# Patient Record
Sex: Female | Born: 1970
Health system: Southern US, Community
[De-identification: ages and names within clinical notes are randomized; demographics above are authoritative.]

## PROBLEM LIST (undated history)

## (undated) DIAGNOSIS — E162 Hypoglycemia, unspecified: Secondary | ICD-10-CM

## (undated) DIAGNOSIS — D1802 Hemangioma of intracranial structures: Secondary | ICD-10-CM

## (undated) HISTORY — PX: TUBAL LIGATION: SHX77

## (undated) HISTORY — DX: Hypoglycemia, unspecified: E16.2

---

## 2014-06-10 ENCOUNTER — Emergency Department
Admission: EM | Admit: 2014-06-10 | Discharge: 2014-06-10 | Disposition: A | Discharge status: Home / Self Care | Payer: BLUE CROSS/BLUE SHIELD | Source: Home / Self Care | Attending: Family Medicine | Admitting: Family Medicine

## 2014-06-10 ENCOUNTER — Encounter: Payer: Self-pay | Admitting: *Deleted

## 2014-06-10 ENCOUNTER — Emergency Department (INDEPENDENT_AMBULATORY_CARE_PROVIDER_SITE_OTHER): Payer: BLUE CROSS/BLUE SHIELD

## 2014-06-10 DIAGNOSIS — R51 Headache: Secondary | ICD-10-CM

## 2014-06-10 DIAGNOSIS — M899 Disorder of bone, unspecified: Secondary | ICD-10-CM | POA: Diagnosis not present

## 2014-06-10 DIAGNOSIS — G939 Disorder of brain, unspecified: Secondary | ICD-10-CM | POA: Diagnosis not present

## 2014-06-10 DIAGNOSIS — R519 Headache, unspecified: Secondary | ICD-10-CM

## 2014-06-10 MED ORDER — GADOBENATE DIMEGLUMINE 529 MG/ML IV SOLN
11.0000 mL | Freq: Once | INTRAVENOUS | Status: AC | PRN
  Administered 2014-06-10: 11 mL via INTRAVENOUS

## 2014-06-10 NOTE — Discharge Instructions (Signed)
May take Tylenol for pain as needed.

## 2014-06-10 NOTE — ED Provider Notes (Signed)
CSN: [id number]     Arrival date & time 06/10/14  1247 History   First MD Initiated Contact with Patient 06/10/14 1403     Chief Complaint  Patient presents with  . Headache      HPI Comments: Patient complains of a 2 week history of a dull left sided headache that was initially intermittent and now constant.  She notes that the headache is briefly worse when she rotates her head to the left.  She has tenderness over the left side of her head.  The headache awakens her at night, and she has had poor sleep for two months.  She denies other neurologic symptoms.  Motrin is not helpful. She reports that she had a sharp pain at the same location in her left head two months ago that resolved after about 6 days.  She feels well otherwise.  No fevers, chills, and sweats.  No changes in vision. She has a history of occasional migraine headaches that occur about twice per year, and are distinctly different from her present headache.  Patient is a 44 y.o. female presenting with headaches. The history is provided by the patient.  Headache Pain location:  L parietal and R temporal Quality:  Dull Radiates to:  Does not radiate Severity currently:  6/10 Onset quality:  Gradual Duration:  2 weeks Timing:  Constant Progression:  Worsening Chronicity:  Recurrent Similar to prior headaches: no   Context: not activity, not exposure to bright light, not coughing, not stress and not loud noise   Relieved by:  Nothing Worsened by:  Nothing Ineffective treatments:  NSAIDs Associated symptoms: fatigue   Associated symptoms: no abdominal pain, no blurred vision, no congestion, no dizziness, no ear pain, no eye pain, no facial pain, no fever, no focal weakness, no hearing loss, no loss of balance, no nausea, no near-syncope, no neck pain, no neck stiffness, no numbness, no paresthesias, no photophobia, no seizures, no sinus pressure, no swollen glands, no visual change and no weakness     History reviewed. No  pertinent past medical history. Past Surgical History  Procedure Laterality Date  . Cesarean section    . Tubal ligation Bilateral    Family History  Problem Relation Age of Onset  . Cancer Mother     Breast CA  . Diabetes Father    History  Substance Use Topics  . Smoking status: Never Smoker   . Smokeless tobacco: Never Used  . Alcohol Use: No   OB History    No data available     Review of Systems  Constitutional: Positive for fatigue. Negative for fever.  HENT: Negative for congestion, ear pain, hearing loss and sinus pressure.   Eyes: Negative for blurred vision, photophobia and pain.  Cardiovascular: Negative for near-syncope.  Gastrointestinal: Negative for nausea and abdominal pain.  Musculoskeletal: Negative for neck pain and neck stiffness.  Neurological: Positive for headaches. Negative for dizziness, focal weakness, seizures, weakness, numbness, paresthesias and loss of balance.  All other systems reviewed and are negative.   Allergies  Ciprofloxacin  Home Medications   Prior to Admission medications   Medication Sig Start Date End Date Taking? Authorizing Provider  Loratadine (CLARITIN PO) Take by mouth.   Yes Historical Provider, MD   BP 103/71 mmHg  Pulse 81  Temp(Src) 98.5 F (36.9 C) (Oral)  Resp 16  Ht 5' 4.5" (1.638 m)  Wt 124 lb (56.246 kg)  BMI 20.96 kg/m2  SpO2 99%  LMP 05/17/2014 Physical Exam  Constitutional: She is oriented to person, place, and time. She appears well-developed and well-nourished. No distress.  HENT:  Head: Atraumatic.    Right Ear: Tympanic membrane, external ear and ear canal normal.  Left Ear: Tympanic membrane, external ear and ear canal normal.  Nose: Nose normal.  Mouth/Throat: Oropharynx is clear and moist.  There is distinct tenderness over the left temporal/parietal area as noted on diagram.  No rash, swelling, or crepitance. No temporal artery tenderness  Eyes: Conjunctivae and EOM are normal. Pupils  are equal, round, and reactive to light.  Fundi benign.  No photophobia.  Neck: Normal range of motion. Neck supple. No thyromegaly present.  Cardiovascular: Normal rate, regular rhythm, normal heart sounds and intact distal pulses.   No murmur heard. Pulmonary/Chest: Breath sounds normal.  Abdominal: Bowel sounds are normal. There is no tenderness.  Musculoskeletal: She exhibits no edema.  Lymphadenopathy:    She has no cervical adenopathy.  Neurological: She is alert and oriented to person, place, and time. She has normal reflexes. She exhibits normal muscle tone. Coordination normal.  Cerebellar function is intact.  Skin: Skin is warm and dry. No rash noted.  Nursing note and vitals reviewed.   ED Course  Procedures  none  Imaging Review Mr Kizzie Fantasia Contrast  06/10/2014   CLINICAL DATA:  New onset of LEFT temporal and parietal headache. Constant for the past 10 days. Tender to the touch. Initial encounter.  EXAM: MRI HEAD WITHOUT AND WITH CONTRAST  TECHNIQUE: Multiplanar, multiecho pulse sequences of the brain and surrounding structures were obtained without and with intravenous contrast.  CONTRAST:  86mL MULTIHANCE GADOBENATE DIMEGLUMINE 529 MG/ML IV SOLN  COMPARISON:  None.  FINDINGS: Over the LEFT posterior frontal convexity, lying within the diploic space of the skull, is a subcentimeter T2 hyperintense diploic lesion, approximately 5 x 7 mm cross-section as seen on image 17 series 7.  The lesion does appear to display facilitated diffusion, but in the cortex immediately adjacent, there is asymmetric restricted diffusion confirmed on coronal imaging. Significance is uncertain. Focal area of mild ischemia not excluded. Infection appears unlikely.  The calvarial lesion is observed on T2 and FLAIR axial imaging, with a less well-defined area of both T2 and FLAIR hyperintensity as well as enhancement throughout the surrounding diploic space up to 1.5 cm in diameter. Postcontrast enhancement  is both focal and diffuse, again involving the small subcentimeter nidus as seen on postcontrast axial image 36 series 12, as well as more cloudlike in the diploic space extending around that structure as seen on coronal postcontrast image 10 series 13. I suspect this represents an intradiploic hemangioma. An intradiploic meningioma, epidermoid, or dermoid, are possibilities, but less favored. While a vascular metastasis cannot completely be excluded there is no history of such. The overlying scalp does not appear involved. The outer table of the skull is not clearly breached, but CT would be more definitive.  Within the brain itself, there is no visible acute hemorrhage, intra-axial mass lesion, hydrocephalus, or extra-axial fluid. Flow voids are maintained. No midline abnormality. Post infusion imaging demonstrates no abnormal enhancement of brain or meninges. Paranasal sinuses, mastoids, and orbits are unremarkable.  IMPRESSION: Abnormal enhancing lesion within the diploic space of the calvarium near the vertex posterior frontal region. Symptomatic calvarial hemangioma could have this appearance. Intradiploic epidermoid not excluded.  Tiny focus of diffusion restriction of the LEFT posterior frontal cortex immediately adjacent to this calvarial lesion of uncertain significance. See discussion above.   Electronically  Signed   By: Rolla Flatten M.D.   On: 06/10/2014 17:43     MDM   1. Left-sided headache   2. Skull lesion     May take Tylenol for pain as needed. Refer to neurosurgeon as soon as possible for further evaluation and treatment.   [name], MD 06/10/14 1910

## 2014-06-10 NOTE — ED Notes (Addendum)
Pt c/o HA on left side of head x 2 weeks, now unrelieved by IBF. 2 months ago she was feeling a sharp pain in the same location and later a shingles-like burning in the same area. She is otherwise asymptomatic denying visual disturbances, nausea, dizziness or gait changes. No H/O trauma.

## 2014-06-10 NOTE — ED Notes (Signed)
Patient returning @ 430 for MRI of head.

## 2014-06-11 ENCOUNTER — Telehealth: Payer: Self-pay | Admitting: Emergency Medicine

## 2014-06-11 NOTE — ED Notes (Signed)
Sent referral to [facility] for Dr Jovita Gamma to review, sent notes, MRI w & w/o contrast. They will call patient with appt.

## 2014-06-13 NOTE — ED Notes (Signed)
Apt with Dr. Sherwood Gambler scheduled for 06/25/14 @ 2pm. Pt scheduled and notified by [facility]. Rec'd fax notification .

## 2014-06-14 ENCOUNTER — Telehealth: Payer: Self-pay | Admitting: *Deleted

## 2014-06-28 ENCOUNTER — Encounter: Payer: Self-pay | Admitting: Family Medicine

## 2014-06-28 ENCOUNTER — Other Ambulatory Visit (HOSPITAL_COMMUNITY)
Admission: RE | Admit: 2014-06-28 | Discharge: 2014-06-28 | Disposition: A | Discharge status: Home / Self Care | Payer: BLUE CROSS/BLUE SHIELD | Source: Ambulatory Visit | Attending: Family Medicine | Admitting: Family Medicine

## 2014-06-28 ENCOUNTER — Ambulatory Visit (INDEPENDENT_AMBULATORY_CARE_PROVIDER_SITE_OTHER): Payer: BLUE CROSS/BLUE SHIELD | Admitting: Family Medicine

## 2014-06-28 VITALS — BP 111/67 | HR 82 | Ht 64.5 in | Wt 123.0 lb

## 2014-06-28 DIAGNOSIS — Z01419 Encounter for gynecological examination (general) (routine) without abnormal findings: Secondary | ICD-10-CM | POA: Insufficient documentation

## 2014-06-28 DIAGNOSIS — Z0000 Encounter for general adult medical examination without abnormal findings: Secondary | ICD-10-CM | POA: Diagnosis not present

## 2014-06-28 DIAGNOSIS — Z1151 Encounter for screening for human papillomavirus (HPV): Secondary | ICD-10-CM | POA: Insufficient documentation

## 2014-06-28 DIAGNOSIS — D1800 Hemangioma unspecified site: Secondary | ICD-10-CM

## 2014-06-28 DIAGNOSIS — B029 Zoster without complications: Secondary | ICD-10-CM | POA: Diagnosis not present

## 2014-06-28 DIAGNOSIS — Z1239 Encounter for other screening for malignant neoplasm of breast: Secondary | ICD-10-CM | POA: Diagnosis not present

## 2014-06-28 NOTE — Patient Instructions (Signed)
Dr. Janautica Netzley's General Advice Following Your Complete Physical Exam  The Benefits of Regular Exercise: Unless you suffer from an uncontrolled cardiovascular condition, studies strongly suggest that regular exercise and physical activity will add to both the quality and length of your life.  The World Health Organization recommends 150 minutes of moderate intensity aerobic activity every week.  This is best split over 3-4 days a week, and can be as simple as a brisk walk for just over 35 minutes "most days of the week".  This type of exercise has been shown to lower LDL-Cholesterol, lower average blood sugars, lower blood pressure, lower cardiovascular disease risk, improve memory, and increase one's overall sense of wellbeing.  The addition of anaerobic (or "strength training") exercises offers additional benefits including but not limited to increased metabolism, prevention of osteoporosis, and improved overall cholesterol levels.  How Can I Strive For A Low-Fat Diet?: Current guidelines recommend that 25-35 percent of your daily energy (food) intake should come from fats.  One might ask how can this be achieved without having to dissect each meal on a daily basis?  Switch to skim or 1% milk instead of whole milk.  Focus on lean meats such as ground turkey, fresh fish, baked chicken, and lean cuts of beef as your source of dietary protein.  Limit saturated fat consumption to less than 10% of your daily caloric intake.  Limit trans fatty acid consumption primarily by limiting synthetic trans fats such as partially hydrogenated oils (Ex: fried fast foods).  Substitute olive or vegetable oil for solid fats where possible.  Moderation of Salt Intake: Provided you don't carry a diagnosis of congestive heart failure nor renal failure, I recommend a daily allowance of no more than 2300 mg of salt (sodium).  Keeping under this daily goal is associated with a decreased risk of cardiovascular events, creeping  above it can lead to elevated blood pressures and increases your risk of cardiovascular events.  Milligrams (mg) of salt is listed on all nutrition labels, and your daily intake can add up faster than you think.  Most canned and frozen dinners can pack in over half your daily salt allowance in one meal.    Lifestyle Health Risks: Certain lifestyle choices carry specific health risks.  As you may already know, tobacco use has been associated with increasing one's risk of cardiovascular disease, pulmonary disease, numerous cancers, among many other issues.  What you may not know is that there are medications and nicotine replacement strategies that can more than double your chances of successfully quitting.  I would be thrilled to help manage your quitting strategy if you currently use tobacco products.  When it comes to alcohol use, I've yet to find an "ideal" daily allowance.  Provided an individual does not have a medical condition that is exacerbated by alcohol consumption, general guidelines determine "safe drinking" as no more than two standard drinks for a man or no more than one standard drink for a female per day.  However, much debate still exists on whether any amount of alcohol consumption is technically "safe".  My general advice, keep alcohol consumption to a minimum for general health promotion.  If you or others believe that alcohol, tobacco, or recreational drug use is interfering with your life, I would be happy to provide confidential counseling regarding treatment options.  General "Over The Counter" Nutrition Advice: Postmenopausal women should aim for a daily calcium intake of 1200 mg, however a significant portion of this might already be   provided by diets including milk, yogurt, cheese, and other dairy products.  Vitamin D has been shown to help preserve bone density, prevent fatigue, and has even been shown to help reduce falls in the elderly.  Ensuring a daily intake of 800 Units of  Vitamin D is a good place to start to enjoy the above benefits, we can easily check your Vitamin D level to see if you'd potentially benefit from supplementation beyond 800 Units a day.  Folic Acid intake should be of particular concern to women of childbearing age.  Daily consumption of 400-800 mcg of Folic Acid is recommended to minimize the chance of spinal cord defects in a fetus should pregnancy occur.    For many adults, accidents still remain one of the most common culprits when it comes to cause of death.  Some of the simplest but most effective preventitive habits you can adopt include regular seatbelt use, proper helmet use, securing firearms, and regularly testing your smoke and carbon monoxide detectors.  Nura Cahoon B. Skye Rodarte DO [facility] [address], [address] [place], [place] [address] Phone: [phone]  

## 2014-06-28 NOTE — Progress Notes (Signed)
CC: Barbara Nolan is a 44 y.o. female is here for Establish Care and CPE   Subjective: HPI:  Colonoscopy: No family history of colon cancer will begin screening at age 60 Papsmear: No history of abnormals, most recent Pap smear was in 2014, she would like to have it repeated today and she is aware that current guidelines only recommend this every 3 years. Mammogram: She gets these every 2 years and is overdue. Plan to continue this frequency until she turns 48, annually thereafter.  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 She has a history of shingles from 2011 and had a recent diagnosis of hemangioma involving the left skull which is being followed by neurosurgery and the only intervention is going to be a repeat MRI sometime later this year.  Review of Systems - General ROS: negative for - chills, fever, night sweats, weight gain or weight loss Ophthalmic ROS: negative for - decreased vision Psychological ROS: negative for - anxiety or depression ENT ROS: negative for - hearing change, nasal congestion, tinnitus or allergies Hematological and Lymphatic ROS: negative for - bleeding problems, bruising or swollen lymph nodes Breast ROS: negative Respiratory ROS: no cough, shortness of breath, or wheezing Cardiovascular ROS: no chest pain or dyspnea on exertion Gastrointestinal ROS: no abdominal pain, change in bowel habits, or black or bloody stools Genito-Urinary ROS: negative for - genital discharge, genital ulcers, incontinence or abnormal bleeding from genitals Musculoskeletal ROS: negative for - joint pain or muscle pain Neurological ROS: negative for - headaches or memory loss Dermatological ROS: negative for lumps, mole changes, rash and skin lesion changes   History reviewed. No pertinent past medical history.  Past Surgical History   Procedure Laterality Date  . Cesarean section    . Tubal ligation Bilateral    Family History  Problem Relation Age of Onset  . Cancer Mother     Breast CA  . Diabetes Father     History   Social History  . Marital Status: Married    Spouse Name: N/A  . Number of Children: N/A  . Years of Education: N/A   Occupational History  . Not on file.   Social History Main Topics  . Smoking status: Never Smoker   . Smokeless tobacco: Never Used  . Alcohol Use: No  . Drug Use: No  . Sexual Activity:    Partners: Male   Other Topics Concern  . Not on file   Social History Narrative     Objective: BP 111/67 mmHg  Pulse 82  Ht 5' 4.5" (1.638 m)  Wt 123 lb (55.792 kg)  BMI 20.79 kg/m2  LMP 05/17/2014 General: No Acute Distress HEENT: Atraumatic, normocephalic, conjunctivae normal without scleral icterus.  No nasal discharge, hearing grossly intact, TMs with good landmarks bilaterally with no middle ear abnormalities, posterior pharynx clear without oral lesions. Neck: Supple, trachea midline, no cervical nor supraclavicular adenopathy. Pulmonary: Clear to auscultation bilaterally without wheezing, rhonchi, nor rales. Cardiac: Regular rate and rhythm.  No murmurs, rubs, nor gallops. No peripheral edema.  2+ peripheral pulses bilaterally. Abdomen: Bowel sounds normal.  No masses.  Non-tender without rebound.  Negative Murphy's sign. GU: Labia majora & minora without lesions.  Distal urethra unremarkable.  Vaginal wall integrity preserved without mucosal lesions.  Cervix non-tender and without gross lesions nor discharge. MSK: Grossly intact, no signs of weakness.  Full strength throughout upper  and lower extremities.  Full ROM in upper and lower extremities.  No midline spinal tenderness. Neuro: Gait unremarkable, CN II-XII grossly intact.  C5-C6 Reflex 2/4 Bilaterally, L4 Reflex 2/4 Bilaterally.  Cerebellar function intact. Skin: No rashes. Psych: Alert and oriented to  person/place/time.  Thought process normal. No anxiety/depression.   Assessment & Plan: Blue was seen today for establish care and cpe.  Diagnoses and all orders for this visit:  Annual physical exam Orders: -     Lipid panel -     Comp Met (CMET) -     CBC -     Cytology - PAP  Screening breast examination Orders: -     MM DIGITAL SCREENING BILATERAL; Future  Hemangioma  Shingles  Healthy lifestyle interventions including but not limited to regular exercise, a healthy low fat diet, moderation of salt intake, the dangers of tobacco/alcohol/recreational drug use, nutrition supplementation, and accident avoidance were discussed with the patient and a handout was provided for future reference.  Return in about 1 year (around 06/28/2015) for Annual Visits.

## 2014-07-03 LAB — CYTOLOGY - PAP

## 2014-07-05 LAB — COMPREHENSIVE METABOLIC PANEL
AST: 14 U/L (ref 0–37)
Albumin: 3.9 g/dL (ref 3.5–5.2)
Alkaline Phosphatase: 67 U/L (ref 39–117)
BILIRUBIN TOTAL: 0.8 mg/dL (ref 0.2–1.2)
BUN: 6 mg/dL (ref 6–23)
CHLORIDE: 108 meq/L (ref 96–112)
CO2: 25 meq/L (ref 19–32)
Calcium: 9 mg/dL (ref 8.4–10.5)
Creat: 0.7 mg/dL (ref 0.50–1.10)
Glucose, Bld: 83 mg/dL (ref 70–99)
Potassium: 4.4 mEq/L (ref 3.5–5.3)
SODIUM: 137 meq/L (ref 135–145)
Total Protein: 6.3 g/dL (ref 6.0–8.3)

## 2014-07-05 LAB — CBC
HEMATOCRIT: 37.7 % (ref 36.0–46.0)
HEMOGLOBIN: 12.3 g/dL (ref 12.0–15.0)
MCH: 29.1 pg (ref 26.0–34.0)
MCHC: 32.6 g/dL (ref 30.0–36.0)
MCV: 89.3 fL (ref 78.0–100.0)
MPV: 10.1 fL (ref 8.6–12.4)
Platelets: 273 10*3/uL (ref 150–400)
RBC: 4.22 MIL/uL (ref 3.87–5.11)
RDW: 13.9 % (ref 11.5–15.5)
WBC: 5.2 10*3/uL (ref 4.0–10.5)

## 2014-07-05 LAB — LIPID PANEL
Cholesterol: 189 mg/dL (ref 0–200)
HDL: 55 mg/dL (ref >=46)
LDL Cholesterol: 116 mg/dL — ABNORMAL HIGH (ref 0–99)
Total CHOL/HDL Ratio: 3.4 Ratio
Triglycerides: 91 mg/dL (ref <150)
VLDL: 18 mg/dL (ref 0–40)

## 2014-07-10 ENCOUNTER — Ambulatory Visit (INDEPENDENT_AMBULATORY_CARE_PROVIDER_SITE_OTHER): Payer: BLUE CROSS/BLUE SHIELD

## 2014-07-10 DIAGNOSIS — Z1239 Encounter for other screening for malignant neoplasm of breast: Secondary | ICD-10-CM

## 2014-07-10 DIAGNOSIS — Z1231 Encounter for screening mammogram for malignant neoplasm of breast: Secondary | ICD-10-CM

## 2014-12-29 ENCOUNTER — Emergency Department
Admission: EM | Admit: 2014-12-29 | Discharge: 2014-12-29 | Disposition: A | Discharge status: Home / Self Care | Payer: BLUE CROSS/BLUE SHIELD | Source: Home / Self Care | Attending: Family Medicine | Admitting: Family Medicine

## 2014-12-29 ENCOUNTER — Encounter: Payer: Self-pay | Admitting: Emergency Medicine

## 2014-12-29 DIAGNOSIS — N3090 Cystitis, unspecified without hematuria: Secondary | ICD-10-CM | POA: Diagnosis not present

## 2014-12-29 LAB — POCT URINALYSIS DIP (MANUAL ENTRY)
Bilirubin, UA: NEGATIVE
GLUCOSE UA: NEGATIVE
Ketones, POC UA: NEGATIVE
NITRITE UA: NEGATIVE
PROTEIN UA: NEGATIVE
Spec Grav, UA: 1.015 (ref 1.005–1.03)
Urobilinogen, UA: 0.2 (ref 0–1)
pH, UA: 6 (ref 5–8)

## 2014-12-29 MED ORDER — NITROFURANTOIN MONOHYD MACRO 100 MG PO CAPS
100.0000 mg | ORAL_CAPSULE | Freq: Two times a day (BID) | ORAL | Status: DC
Start: 2014-12-29 — End: 2015-07-21

## 2014-12-29 NOTE — Discharge Instructions (Signed)
May use non-prescription AZO for about two days, if desired, to decrease urinary discomfort.  Increase fluid intake. If symptoms become significantly worse during the night or over the weekend, proceed to the local emergency room.  Followup with Family Doctor if not improved in one week.

## 2014-12-29 NOTE — ED Notes (Signed)
Reports onset of dysuria and frequency of urination over past 24 hours.

## 2014-12-29 NOTE — ED Provider Notes (Signed)
CSN: [id number]     Arrival date & time 12/29/14  1101 History   First MD Initiated Contact with Patient 12/29/14 1121     Chief Complaint  Patient presents with  . Dysuria  . Urinary Frequency      HPI Comments: Patient developed dysuria, frequency, and urgency within the last 24 hours.  Patient is a 44 y.o. female presenting with dysuria. The history is provided by the patient.  Dysuria Pain quality:  Burning Pain severity:  Mild Onset quality:  Sudden Duration:  1 day Timing:  Constant Progression:  Worsening Chronicity:  New Recent urinary tract infections: no   Relieved by:  None tried Worsened by:  Nothing tried Ineffective treatments:  None tried Urinary symptoms: frequent urination and hesitancy   Urinary symptoms: no discolored urine, no foul-smelling urine, no hematuria and no bladder incontinence   Associated symptoms: no abdominal pain, no fever, no flank pain, no nausea, no vaginal discharge and no vomiting   Risk factors: sexually active     History reviewed. No pertinent past medical history. Past Surgical History  Procedure Laterality Date  . Cesarean section    . Tubal ligation Bilateral    Family History  Problem Relation Age of Onset  . Cancer Mother     Breast CA  . Diabetes Father    Social History  Substance Use Topics  . Smoking status: Never Smoker   . Smokeless tobacco: Never Used  . Alcohol Use: No   OB History    No data available     Review of Systems  Constitutional: Negative for fever.  Gastrointestinal: Negative for nausea, vomiting and abdominal pain.  Genitourinary: Positive for dysuria. Negative for flank pain and vaginal discharge.  All other systems reviewed and are negative.   Allergies  Ciprofloxacin  Home Medications   Prior to Admission medications   Medication Sig Start Date End Date Taking? Authorizing Provider  Loratadine (CLARITIN PO) Take by mouth.    Historical Provider, MD  nitrofurantoin,  macrocrystal-monohydrate, (MACROBID) 100 MG capsule Take 1 capsule (100 mg total) by mouth 2 (two) times daily. Take with food. 12/29/14   [name], MD   Meds Ordered and Administered this Visit  Medications - No data to display  BP 103/69 mmHg  Pulse 90  Temp(Src) 98.2 F (36.8 C) (Oral)  Resp 16  Ht 5\' 4"  (1.626 m)  Wt 127 lb (57.607 kg)  BMI 21.79 kg/m2  SpO2 96% No data found.   Physical Exam Nursing notes and Vital Signs reviewed. Appearance:  Patient appears stated age, and in no acute distress Eyes:  Pupils are equal, round, and reactive to light and accomodation.  Extraocular movement is intact.  Conjunctivae are not inflamed  Nose:  Normal  Pharynx:  Normal; moist mucous membranes  Neck:  Supple.   No adenopathy Lungs:  Clear to auscultation.  Breath sounds are equal.  Moving air well. Heart:  Regular rate and rhythm without murmurs, rubs, or gallops.  Abdomen:  Nontender without masses or hepatosplenomegaly.  Bowel sounds are present.  No CVA or flank tenderness.  Extremities:  No edema.   Skin:  No rash present.   ED Course  Procedures  None    Labs Reviewed  URINE CULTURE  POCT URINALYSIS DIP (MANUAL ENTRY):  BLO small; LEU moderate; otherwise negative     MDM   1. Cystitis    Urine culture pending. Begin Macrobid 100mg  BID for one week. May use non-prescription AZO  for about two days, if desired, to decrease urinary discomfort.  Increase fluid intake. If symptoms become significantly worse during the night or over the weekend, proceed to the local emergency room.  Followup with Family Doctor if not improved in one week.     [name], MD 12/29/14 1136

## 2014-12-30 LAB — URINE CULTURE
COLONY COUNT: NO GROWTH
Organism ID, Bacteria: NO GROWTH

## 2014-12-31 ENCOUNTER — Telehealth: Payer: Self-pay | Admitting: Emergency Medicine

## 2015-07-21 ENCOUNTER — Ambulatory Visit (INDEPENDENT_AMBULATORY_CARE_PROVIDER_SITE_OTHER): Payer: BLUE CROSS/BLUE SHIELD | Admitting: Family Medicine

## 2015-07-21 ENCOUNTER — Encounter: Payer: Self-pay | Admitting: Family Medicine

## 2015-07-21 VITALS — BP 121/80 | HR 97 | Wt 127.0 lb

## 2015-07-21 DIAGNOSIS — Z1239 Encounter for other screening for malignant neoplasm of breast: Secondary | ICD-10-CM | POA: Diagnosis not present

## 2015-07-21 DIAGNOSIS — Z0000 Encounter for general adult medical examination without abnormal findings: Secondary | ICD-10-CM

## 2015-07-21 NOTE — Patient Instructions (Signed)
Dr. Poonam Woehrle's General Advice Following Your Complete Physical Exam  The Benefits of Regular Exercise: Unless you suffer from an uncontrolled cardiovascular condition, studies strongly suggest that regular exercise and physical activity will add to both the quality and length of your life.  The World Health Organization recommends 150 minutes of moderate intensity aerobic activity every week.  This is best split over 3-4 days a week, and can be as simple as a brisk walk for just over 35 minutes "most days of the week".  This type of exercise has been shown to lower LDL-Cholesterol, lower average blood sugars, lower blood pressure, lower cardiovascular disease risk, improve memory, and increase one's overall sense of wellbeing.  The addition of anaerobic (or "strength training") exercises offers additional benefits including but not limited to increased metabolism, prevention of osteoporosis, and improved overall cholesterol levels.  How Can I Strive For A Low-Fat Diet?: Current guidelines recommend that 25-35 percent of your daily energy (food) intake should come from fats.  One might ask how can this be achieved without having to dissect each meal on a daily basis?  Switch to skim or 1% milk instead of whole milk.  Focus on lean meats such as ground turkey, fresh fish, baked chicken, and lean cuts of beef as your source of dietary protein.  Limit saturated fat consumption to less than 10% of your daily caloric intake.  Limit trans fatty acid consumption primarily by limiting synthetic trans fats such as partially hydrogenated oils (Ex: fried fast foods).  Substitute olive or vegetable oil for solid fats where possible.  Moderation of Salt Intake: Provided you don't carry a diagnosis of congestive heart failure nor renal failure, I recommend a daily allowance of no more than 2300 mg of salt (sodium).  Keeping under this daily goal is associated with a decreased risk of cardiovascular events, creeping  above it can lead to elevated blood pressures and increases your risk of cardiovascular events.  Milligrams (mg) of salt is listed on all nutrition labels, and your daily intake can add up faster than you think.  Most canned and frozen dinners can pack in over half your daily salt allowance in one meal.    Lifestyle Health Risks: Certain lifestyle choices carry specific health risks.  As you may already know, tobacco use has been associated with increasing one's risk of cardiovascular disease, pulmonary disease, numerous cancers, among many other issues.  What you may not know is that there are medications and nicotine replacement strategies that can more than double your chances of successfully quitting.  I would be thrilled to help manage your quitting strategy if you currently use tobacco products.  When it comes to alcohol use, I've yet to find an "ideal" daily allowance.  Provided an individual does not have a medical condition that is exacerbated by alcohol consumption, general guidelines determine "safe drinking" as no more than two standard drinks for a man or no more than one standard drink for a female per day.  However, much debate still exists on whether any amount of alcohol consumption is technically "safe".  My general advice, keep alcohol consumption to a minimum for general health promotion.  If you or others believe that alcohol, tobacco, or recreational drug use is interfering with your life, I would be happy to provide confidential counseling regarding treatment options.  General "Over The Counter" Nutrition Advice: Postmenopausal women should aim for a daily calcium intake of 1200 mg, however a significant portion of this might already be   provided by diets including milk, yogurt, cheese, and other dairy products.  Vitamin D has been shown to help preserve bone density, prevent fatigue, and has even been shown to help reduce falls in the elderly.  Ensuring a daily intake of 800 Units of  Vitamin D is a good place to start to enjoy the above benefits, we can easily check your Vitamin D level to see if you'd potentially benefit from supplementation beyond 800 Units a day.  Folic Acid intake should be of particular concern to women of childbearing age.  Daily consumption of 400-800 mcg of Folic Acid is recommended to minimize the chance of spinal cord defects in a fetus should pregnancy occur.    For many adults, accidents still remain one of the most common culprits when it comes to cause of death.  Some of the simplest but most effective preventitive habits you can adopt include regular seatbelt use, proper helmet use, securing firearms, and regularly testing your smoke and carbon monoxide detectors.  Vaun Hyndman B. Zeferino Mounts DO [facility] [address], [address] [place], [place] [address] Phone: [phone]  

## 2015-07-21 NOTE — Progress Notes (Signed)
CC: Barbara Nolan is a 45 y.o. female is here for Annual Exam   Subjective: HPI:  Colonoscopy: No current indication Papsmear:Normal last year repeat 06/2017 Mammogram: Due for repeat, order placed   Influenza Vaccine: out of season Pneumovax: no indication Td/Tdap: 2007, she is agreeable to a booster but asks not to get it today and to postpone it for a few days. Zoster: (Start 45 yo)   Requesting physical exam with no complaints  Review of Systems - General ROS: negative for - chills, fever, night sweats, weight gain or weight loss Ophthalmic ROS: negative for - decreased vision Psychological ROS: negative for - anxiety or depression ENT ROS: negative for - hearing change, nasal congestion, tinnitus or allergies Hematological and Lymphatic ROS: negative for - bleeding problems, bruising or swollen lymph nodes Breast ROS: negative Respiratory ROS: no cough, shortness of breath, or wheezing Cardiovascular ROS: no chest pain or dyspnea on exertion Gastrointestinal ROS: no abdominal pain, change in bowel habits, or black or bloody stools Genito-Urinary ROS: negative for - genital discharge, genital ulcers, incontinence or abnormal bleeding from genitals Musculoskeletal ROS: negative for - joint pain or muscle pain Neurological ROS: negative for - headaches or memory loss Dermatological ROS: negative for lumps, mole changes, rash and skin lesion changes  No past medical history on file.  Past Surgical History  Procedure Laterality Date  . Cesarean section    . Tubal ligation Bilateral    Family History  Problem Relation Age of Onset  . Cancer Mother     Breast CA  . Diabetes Father     Social History   Social History  . Marital Status: Married    Spouse Name: N/A  . Number of Children: N/A  . Years of Education: N/A   Occupational History  . Not on file.   Social History Main Topics  . Smoking status: Never Smoker   . Smokeless tobacco: Never Used  . Alcohol Use:  No  . Drug Use: No  . Sexual Activity:    Partners: Male   Other Topics Concern  . Not on file   Social History Narrative     Objective: BP 121/80 mmHg  Pulse 97  Wt 127 lb (57.607 kg)  General: No Acute Distress HEENT: Atraumatic, normocephalic, conjunctivae normal without scleral icterus.  No nasal discharge, hearing grossly intact, TMs with good landmarks bilaterally with no middle ear abnormalities, posterior pharynx clear without oral lesions. Neck: Supple, trachea midline, no cervical nor supraclavicular adenopathy. Pulmonary: Clear to auscultation bilaterally without wheezing, rhonchi, nor rales. Cardiac: Regular rate and rhythm.  No murmurs, rubs, nor gallops. No peripheral edema.  2+ peripheral pulses bilaterally. Abdomen: Bowel sounds normal.  No masses.  Non-tender without rebound.  Negative Murphy's sign. MSK: Grossly intact, no signs of weakness.  Full strength throughout upper and lower extremities.  Full ROM in upper and lower extremities.  No midline spinal tenderness. Neuro: Gait unremarkable, CN II-XII grossly intact.  C5-C6 Reflex 2/4 Bilaterally, L4 Reflex 2/4 Bilaterally.  Cerebellar function intact. Skin: No rashes. Psych: Alert and oriented to person/place/time.  Thought process normal. No anxiety/depression.  Assessment & Plan: Barbara Nolan was seen today for annual exam.  Diagnoses and all orders for this visit:  Annual physical exam -     Lipid panel -     CBC -     COMPLETE METABOLIC PANEL WITH GFR  Screening for breast cancer -     MM DIGITAL SCREENING BILATERAL; Future   Healthy lifestyle  interventions including but not limited to regular exercise, a healthy low fat diet, moderation of salt intake, the dangers of tobacco/alcohol/recreational drug use, nutrition supplementation, and accident avoidance were discussed with the patient and a handout was provided for future reference.  Return for CPE.

## 2015-07-30 LAB — CBC
HCT: 36.4 % (ref 35.0–45.0)
Hemoglobin: 11.7 g/dL (ref 11.7–15.5)
MCH: 28 pg (ref 27.0–33.0)
MCHC: 32.1 g/dL (ref 32.0–36.0)
MCV: 87.1 fL (ref 80.0–100.0)
MPV: 9.9 fL (ref 7.5–12.5)
PLATELETS: 334 10*3/uL (ref 140–400)
RBC: 4.18 MIL/uL (ref 3.80–5.10)
RDW: 14.1 % (ref 11.0–15.0)
WBC: 4.7 10*3/uL (ref 3.8–10.8)

## 2015-07-31 ENCOUNTER — Ambulatory Visit (INDEPENDENT_AMBULATORY_CARE_PROVIDER_SITE_OTHER): Payer: BLUE CROSS/BLUE SHIELD | Admitting: Sports Medicine

## 2015-07-31 DIAGNOSIS — Z23 Encounter for immunization: Secondary | ICD-10-CM

## 2015-07-31 LAB — COMPLETE METABOLIC PANEL WITH GFR
ALT: 6 U/L (ref 6–29)
AST: 15 U/L (ref 10–35)
Albumin: 4.1 g/dL (ref 3.6–5.1)
Alkaline Phosphatase: 69 U/L (ref 33–115)
BILIRUBIN TOTAL: 0.8 mg/dL (ref 0.2–1.2)
BUN: 9 mg/dL (ref 7–25)
CO2: 22 mmol/L (ref 20–31)
Calcium: 8.7 mg/dL (ref 8.6–10.2)
Chloride: 105 mmol/L (ref 98–110)
Creat: 0.81 mg/dL (ref 0.50–1.10)
GFR, EST NON AFRICAN AMERICAN: 87 mL/min (ref >=60)
GLUCOSE: 87 mg/dL (ref 65–99)
POTASSIUM: 3.8 mmol/L (ref 3.5–5.3)
Sodium: 138 mmol/L (ref 135–146)
Total Protein: 6.7 g/dL (ref 6.1–8.1)

## 2015-07-31 LAB — LIPID PANEL
CHOL/HDL RATIO: 3.4 ratio (ref <=5.0)
Cholesterol: 196 mg/dL (ref 125–200)
HDL: 58 mg/dL (ref >=46)
LDL CALC: 114 mg/dL (ref <130)
Triglycerides: 119 mg/dL (ref <150)
VLDL: 24 mg/dL (ref <30)

## 2015-07-31 NOTE — Progress Notes (Signed)
Pt presents to clinic for tdap injection. Inject was giving in the right deltoid. Pt tolerated injection well without any complications. -[facility]

## 2015-07-31 NOTE — Patient Instructions (Signed)
Pt advise to f/u prn.

## 2015-08-08 ENCOUNTER — Ambulatory Visit (INDEPENDENT_AMBULATORY_CARE_PROVIDER_SITE_OTHER): Payer: BLUE CROSS/BLUE SHIELD

## 2015-08-08 DIAGNOSIS — Z1231 Encounter for screening mammogram for malignant neoplasm of breast: Secondary | ICD-10-CM

## 2015-08-08 DIAGNOSIS — Z1239 Encounter for other screening for malignant neoplasm of breast: Secondary | ICD-10-CM

## 2015-12-09 ENCOUNTER — Ambulatory Visit (INDEPENDENT_AMBULATORY_CARE_PROVIDER_SITE_OTHER): Payer: BLUE CROSS/BLUE SHIELD | Admitting: Physician Assistant

## 2015-12-09 ENCOUNTER — Encounter: Payer: Self-pay | Admitting: Physician Assistant

## 2015-12-09 VITALS — BP 132/69 | HR 95 | Ht 64.0 in | Wt 127.0 lb

## 2015-12-09 DIAGNOSIS — L74512 Primary focal hyperhidrosis, palms: Secondary | ICD-10-CM | POA: Diagnosis not present

## 2015-12-09 DIAGNOSIS — R5383 Other fatigue: Secondary | ICD-10-CM

## 2015-12-09 DIAGNOSIS — E162 Hypoglycemia, unspecified: Secondary | ICD-10-CM | POA: Diagnosis not present

## 2015-12-09 DIAGNOSIS — R42 Dizziness and giddiness: Secondary | ICD-10-CM

## 2015-12-09 DIAGNOSIS — R251 Tremor, unspecified: Secondary | ICD-10-CM | POA: Diagnosis not present

## 2015-12-09 LAB — POCT URINALYSIS DIPSTICK
Bilirubin, UA: NEGATIVE
Glucose, UA: NEGATIVE
KETONES UA: NEGATIVE
Leukocytes, UA: NEGATIVE
NITRITE UA: NEGATIVE
Protein, UA: NEGATIVE
Spec Grav, UA: 1.015
Urobilinogen, UA: 0.2
pH, UA: 7.5

## 2015-12-09 LAB — GLUCOSE, POCT (MANUAL RESULT ENTRY): POC Glucose: 106 mg/dl — AB (ref 70–99)

## 2015-12-09 MED ORDER — AMBULATORY NON FORMULARY MEDICATION: 0 refills | Status: AC

## 2015-12-09 NOTE — Progress Notes (Signed)
   Subjective:    Patient ID: Barbara Nolan, female    DOB: [birth year], 45 y.o.   MRN: [id number]  HPI  Pt is a 45 yo female who presents to the clinic with what she feels like might be low blood sugar symptoms. She is an Therapist, sports. Her father and grandfather both have diabetes. She doesn't eat breakfast a lot. She has episodes where she is shaky, dizzy, sweaty, and feels like in a brain fog once she eats she feels better. Thursday felt symptomatic and ate 2 peanut butter cookes and felt much better. No increased thirst or urination.     Review of Systems  All other systems reviewed and are negative.      Objective:   Physical Exam  Constitutional: She is oriented to person, place, and time. She appears well-developed and well-nourished.  HENT:  Head: Normocephalic and atraumatic.  Neck: Normal range of motion. Neck supple. No thyromegaly present.  Cardiovascular: Normal rate, regular rhythm and normal heart sounds.   Pulmonary/Chest: Effort normal and breath sounds normal.  Lymphadenopathy:    She has no cervical adenopathy.  Neurological: She is alert and oriented to person, place, and time.  Psychiatric: She has a normal mood and affect. Her behavior is normal.          Assessment & Plan:  Marland KitchenMarland KitchenAngalina was seen today for hypoglycemia.  Diagnoses and all orders for this visit:  Hypoglycemia -     Hemoglobin A1c -     TSH -     Lipase -     C-peptide -     Basic metabolic panel -     POCT urinalysis dipstick  Dizziness -     POCT Glucose (CBG) -     Hemoglobin A1c -     TSH -     Lipase -     C-peptide -     Basic metabolic panel  Shaky  Sweaty palms  No energy  Other orders -     AMBULATORY NON FORMULARY MEDICATION; Blood glucometer, lancets and strips to test up to twice a day for hypoglycemic events.      .. Results for orders placed or performed in visit on 12/09/15  POCT Glucose (CBG)  Result Value Ref Range   POC Glucose 106 (A) 70 - 99 mg/dl  POCT  urinalysis dipstick  Result Value Ref Range   Color, UA yellow    Clarity, UA clear    Glucose, UA neg    Bilirubin, UA neg    Ketones, UA neg    Spec Grav, UA 1.015    Blood, UA trace-intact    pH, UA 7.5    Protein, UA neg    Urobilinogen, UA 0.2    Nitrite, UA neg    Leukocytes, UA Negative Negative   Reassured patient glucose today looked good. Urine good.   Likely sugar is dropping some. Given glucometer to monitor.  Discussed how to control low sugars and how can be a pre-cursor to diabetes. Will continue to monitor.

## 2015-12-09 NOTE — Patient Instructions (Signed)

## 2015-12-10 DIAGNOSIS — R42 Dizziness and giddiness: Secondary | ICD-10-CM | POA: Insufficient documentation

## 2015-12-10 DIAGNOSIS — R251 Tremor, unspecified: Secondary | ICD-10-CM | POA: Insufficient documentation

## 2015-12-10 DIAGNOSIS — L74512 Primary focal hyperhidrosis, palms: Secondary | ICD-10-CM | POA: Insufficient documentation

## 2015-12-10 DIAGNOSIS — R5383 Other fatigue: Secondary | ICD-10-CM | POA: Insufficient documentation

## 2015-12-10 LAB — BASIC METABOLIC PANEL
BUN: 12 mg/dL (ref 7–25)
CALCIUM: 9.1 mg/dL (ref 8.6–10.2)
CO2: 25 mmol/L (ref 20–31)
Chloride: 104 mmol/L (ref 98–110)
Creat: 0.92 mg/dL (ref 0.50–1.10)
GLUCOSE: 92 mg/dL (ref 65–99)
Potassium: 4.2 mmol/L (ref 3.5–5.3)
SODIUM: 138 mmol/L (ref 135–146)

## 2015-12-10 LAB — C-PEPTIDE: C-Peptide: 1.84 ng/mL (ref 0.80–3.85)

## 2015-12-10 LAB — TSH: TSH: 1.19 mIU/L

## 2015-12-10 LAB — HEMOGLOBIN A1C
Hgb A1c MFr Bld: 5.4 % (ref <5.7)
MEAN PLASMA GLUCOSE: 108 mg/dL

## 2015-12-10 LAB — LIPASE: Lipase: 28 U/L (ref 7–60)

## 2015-12-10 NOTE — Progress Notes (Signed)
Call pt:  Labs are very reassuring. C-peptide normal range.  Thyroid looks great.  Lipase normal.  A!C good at 5.4. (5.7 is the start of pre-diabetic range)  Let me know what your sugars are when you have symptoms of hypoglycemia.

## 2016-04-14 DIAGNOSIS — M899 Disorder of bone, unspecified: Secondary | ICD-10-CM | POA: Diagnosis not present

## 2016-04-14 DIAGNOSIS — G9389 Other specified disorders of brain: Secondary | ICD-10-CM | POA: Diagnosis not present

## 2016-04-23 DIAGNOSIS — M899 Disorder of bone, unspecified: Secondary | ICD-10-CM | POA: Diagnosis not present

## 2016-05-06 ENCOUNTER — Encounter: Payer: Self-pay | Admitting: *Deleted

## 2016-05-06 ENCOUNTER — Emergency Department
Admission: EM | Admit: 2016-05-06 | Discharge: 2016-05-06 | Disposition: A | Discharge status: Home / Self Care | Payer: BLUE CROSS/BLUE SHIELD | Source: Home / Self Care | Attending: Family Medicine | Admitting: Family Medicine

## 2016-05-06 DIAGNOSIS — B9789 Other viral agents as the cause of diseases classified elsewhere: Secondary | ICD-10-CM | POA: Diagnosis not present

## 2016-05-06 DIAGNOSIS — J069 Acute upper respiratory infection, unspecified: Secondary | ICD-10-CM

## 2016-05-06 LAB — POCT RAPID STREP A (OFFICE): Rapid Strep A Screen: NEGATIVE

## 2016-05-06 MED ORDER — PREDNISONE 20 MG PO TABS: ORAL_TABLET | ORAL | 0 refills | Status: DC

## 2016-05-06 MED ORDER — AMOXICILLIN 875 MG PO TABS
875.0000 mg | ORAL_TABLET | Freq: Two times a day (BID) | ORAL | 0 refills | Status: DC
Start: 2016-05-06 — End: 2016-08-16

## 2016-05-06 NOTE — Discharge Instructions (Signed)
Take plain guaifenesin (1200mg extended release tabs such as Mucinex) twice daily, with plenty of water, for cough and congestion.  May add Pseudoephedrine (30mg, one or two every 4 to 6 hours) for sinus congestion.  Get adequate rest.   °May use Afrin nasal spray (or generic oxymetazoline) each morning for about 5 days and then discontinue.  Also recommend using saline nasal spray several times daily and saline nasal irrigation (AYR is a common brand).  Use Flonase nasal spray each morning after using Afrin nasal spray and saline nasal irrigation. °Try warm salt water gargles for sore throat.  °Stop all antihistamines for now, and other non-prescription cough/cold preparations. °May take Delsym Cough Suppressant at bedtime for nighttime cough.  °Begin Amoxicillin if not improving about one week or if persistent fever develops   °Follow-up with family doctor if not improving about10 days.  °

## 2016-05-06 NOTE — ED Provider Notes (Signed)
[name]    CSN: [id number] Arrival date & time: 05/06/16  0802     History   Chief Complaint Chief Complaint  Patient presents with  . Sore Throat  . Otalgia    HPI Barbara Nolan is a 46 y.o. female.   Patient developed a mild sore throat 4 days ago that has now become worse.  This morning she developed left earache.  She developed mild cough yesterday, and intermittent minimal sinus congestion.  She has been fatigued, but no fevers, chills, and sweats.  She has a history of seasonal rhinitis, and past history of otitis media.   The history is provided by the patient.    History reviewed. No pertinent past medical history.  Patient Active Problem List   Diagnosis Date Noted  . No energy 12/10/2015  . Sweaty palms 12/10/2015  . Shaky 12/10/2015  . Dizziness 12/10/2015  . Hemangioma 06/28/2014  . Shingles 06/28/2014    Past Surgical History:  Procedure Laterality Date  . CESAREAN SECTION    . TUBAL LIGATION Bilateral     OB History    No data available       Home Medications    Prior to Admission medications   Medication Sig Start Date End Date Taking? Authorizing Provider  AMBULATORY NON FORMULARY MEDICATION Blood glucometer, lancets and strips to test up to twice a day for hypoglycemic events. 12/09/15   Jade L Breeback, PA-C  amoxicillin (AMOXIL) 875 MG tablet Take 1 tablet (875 mg total) by mouth 2 (two) times daily. 05/06/16   [name], MD  Loratadine (CLARITIN PO) Take by mouth as needed.     Historical Provider, MD  predniSONE (DELTASONE) 20 MG tablet Take one tab by mouth twice daily for 5 days, then one daily for 3 days. Take with food. 05/06/16   [name], MD    Family History Family History  Problem Relation Age of Onset  . Cancer Mother     Breast CA  . Diabetes Father     Social History Social History  Substance Use Topics  . Smoking status: Never Smoker  . Smokeless tobacco: Never Used  . Alcohol use No      Allergies   Ciprofloxacin   Review of Systems Review of Systems + sore throat + cough No pleuritic pain No wheezing + mild nasal congestion ? post-nasal drainage No sinus pain/pressure No itchy/red eyes + left earache No hemoptysis No SOB No fever/chills No nausea No vomiting No abdominal pain No diarrhea No urinary symptoms No skin rash + fatigue No myalgias No headache Used OTC meds without relief   Physical Exam Triage Vital Signs ED Triage Vitals [05/06/16 0815]  Enc Vitals Group     BP 124/78     Pulse Rate 93     Resp      Temp 98.1 F (36.7 C)     Temp Source Oral     SpO2 98 %     Weight 128 lb (58.1 kg)     Height      Head Circumference      Peak Flow      Pain Score 5     Pain Loc      Pain Edu?      Excl. in [place]?    No data found.   Updated Vital Signs BP 124/78 (BP Location: Left Arm)   Pulse 93   Temp 98.1 F (36.7 C) (Oral)   Wt 128  lb (58.1 kg)   LMP 04/15/2016   SpO2 98%   BMI 21.97 kg/m   Visual Acuity Right Eye Distance:   Left Eye Distance:   Bilateral Distance:    Right Eye Near:   Left Eye Near:    Bilateral Near:     Physical Exam Nursing notes and Vital Signs reviewed. Appearance:  Patient appears stated age, and in no acute distress Eyes:  Pupils are equal, round, and reactive to light and accomodation.  Extraocular movement is intact.  Conjunctivae are not inflamed  Ears:  Left canal and tympanic membrane normal.  Right canal partly occluded with cerumen, and portion of tympanic membrane visualized appears normal. Nose:  Mildly congested turbinates.  No sinus tenderness.  Pharynx:  Normal Neck:  Supple.  Tender enlarged posterior/lateral nodes are palpated bilaterally  Lungs:  Clear to auscultation.  Breath sounds are equal.  Moving air well. Heart:  Regular rate and rhythm without murmurs, rubs, or gallops.  Abdomen:  Nontender without masses or hepatosplenomegaly.  Bowel sounds are present.  No CVA or  flank tenderness.  Extremities:  No edema.  Skin:  No rash present.    UC Treatments / Results  Labs (all labs ordered are listed, but only abnormal results are displayed)   Labs Reviewed  POCT RAPID STREP A (OFFICE) negative  Tympanometry:  Right ear tympanogram normal; Left ear tympanogram normal  EKG  EKG Interpretation None       Radiology No results found.  Procedures Procedures (including critical care time)  Medications Ordered in UC Medications - No data to display   Initial Impression / Assessment and Plan / UC Course  I have reviewed the triage vital signs and the nursing notes.  Pertinent labs & imaging results that were available during my care of the patient were reviewed by me and considered in my medical decision making (see chart for details).    There is no evidence of bacterial infection today.   Begin prednisone burst/taper. Take plain guaifenesin (1200mg  extended release tabs such as Mucinex) twice daily, with plenty of water, for cough and congestion.  May add Pseudoephedrine (30mg , one or two every 4 to 6 hours) for sinus congestion.  Get adequate rest.   May use Afrin nasal spray (or generic oxymetazoline) each morning for about 5 days and then discontinue.  Also recommend using saline nasal spray several times daily and saline nasal irrigation (AYR is a common brand).  Use Flonase nasal spray each morning after using Afrin nasal spray and saline nasal irrigation. Try warm salt water gargles for sore throat.  Stop all antihistamines for now, and other non-prescription cough/cold preparations. May take Delsym Cough Suppressant at bedtime for nighttime cough.  Begin Amoxicillin if not improving about one week or if persistent fever develops (Given a prescription to hold, with an expiration date)  Follow-up with family doctor if not improving about10 days.     Final Clinical Impressions(s) / UC Diagnoses   Final diagnoses:  Viral URI with cough     New Prescriptions New Prescriptions   AMOXICILLIN (AMOXIL) 875 MG TABLET    Take 1 tablet (875 mg total) by mouth 2 (two) times daily.   PREDNISONE (DELTASONE) 20 MG TABLET    Take one tab by mouth twice daily for 5 days, then one daily for 3 days. Take with food.     [name], MD 05/06/16 [phone]

## 2016-05-06 NOTE — ED Triage Notes (Signed)
Patient c/o 3 days of scratchy throat, developed pain last night and left ear pain this am. Afebrile, minimally congested. Taken IBF 400mg  @6am 

## 2016-08-16 ENCOUNTER — Encounter: Payer: Self-pay | Admitting: *Deleted

## 2016-08-16 ENCOUNTER — Emergency Department (INDEPENDENT_AMBULATORY_CARE_PROVIDER_SITE_OTHER)
Admission: EM | Admit: 2016-08-16 | Discharge: 2016-08-16 | Disposition: A | Discharge status: Home / Self Care | Payer: BLUE CROSS/BLUE SHIELD | Source: Home / Self Care | Attending: Family Medicine | Admitting: Family Medicine

## 2016-08-16 DIAGNOSIS — N3000 Acute cystitis without hematuria: Secondary | ICD-10-CM

## 2016-08-16 HISTORY — DX: Hemangioma of intracranial structures: D18.02

## 2016-08-16 MED ORDER — NITROFURANTOIN MONOHYD MACRO 100 MG PO CAPS: 100.0000 mg | ORAL_CAPSULE | Freq: Two times a day (BID) | ORAL | 0 refills | Status: DC

## 2016-08-16 NOTE — ED Triage Notes (Signed)
Patient c/o dysuria, frequency, flank pain and lower abdominal pain/burning x last night. Took AZO @ 9pm last night. Afebrile.

## 2016-08-16 NOTE — ED Provider Notes (Signed)
[name]    CSN: [id number] Arrival date & time: 08/16/16  0845     History   Chief Complaint Chief Complaint  Patient presents with  . Dysuria  . Abdominal Pain  . Flank Pain    HPI Barbara Nolan is a 46 y.o. female.   Patient yesterday suddenly developed dysuria, frequency, and lower abdominal pressure.   The history is provided by the patient.  Dysuria  Pain quality:  Burning Pain severity:  Moderate Onset quality:  Sudden Duration:  1 day Timing:  Constant Progression:  Worsening Chronicity:  New Recent urinary tract infections: no   Relieved by:  Phenazopyridine Worsened by:  Nothing Urinary symptoms: frequent urination and hesitancy   Urinary symptoms: no discolored urine, no foul-smelling urine, no hematuria and no bladder incontinence   Associated symptoms: abdominal pain and flank pain   Associated symptoms: no nausea and no vomiting   Abdominal Pain  Associated symptoms: dysuria   Associated symptoms: no nausea and no vomiting   Flank Pain  Associated symptoms include abdominal pain.    Past Medical History:  Diagnosis Date  . Hemangioma of intracranial structure [facility])     Patient Active Problem List   Diagnosis Date Noted  . No energy 12/10/2015  . Sweaty palms 12/10/2015  . Shaky 12/10/2015  . Dizziness 12/10/2015  . Hemangioma 06/28/2014  . Shingles 06/28/2014    Past Surgical History:  Procedure Laterality Date  . CESAREAN SECTION    . TUBAL LIGATION Bilateral     OB History    No data available       Home Medications    Prior to Admission medications   Medication Sig Start Date End Date Taking? Authorizing Provider  AMBULATORY NON FORMULARY MEDICATION Blood glucometer, lancets and strips to test up to twice a day for hypoglycemic events. 12/09/15   Breeback, Jade L, PA-C  Loratadine (CLARITIN PO) Take by mouth as needed.     [provider]  nitrofurantoin, macrocrystal-monohydrate, (MACROBID) 100 MG  capsule Take 1 capsule (100 mg total) by mouth 2 (two) times daily. Take with food. 08/16/16   [name], MD    Family History Family History  Problem Relation Age of Onset  . Cancer Mother        Breast CA  . Diabetes Father     Social History Social History  Substance Use Topics  . Smoking status: Never Smoker  . Smokeless tobacco: Never Used  . Alcohol use No     Allergies   Ciprofloxacin   Review of Systems Review of Systems  Gastrointestinal: Positive for abdominal pain. Negative for nausea and vomiting.  Genitourinary: Positive for dysuria and flank pain.  All other systems reviewed and are negative.    Physical Exam Triage Vital Signs ED Triage Vitals  Enc Vitals Group     BP 08/16/16 0854 112/68     Pulse Rate 08/16/16 0854 84     Resp --      Temp 08/16/16 0854 97.7 F (36.5 C)     Temp Source 08/16/16 0854 Oral     SpO2 08/16/16 0854 98 %     Weight 08/16/16 0857 130 lb (59 kg)     Height 08/16/16 0857 5\' 5"  (1.651 m)     Head Circumference --      Peak Flow --      Pain Score 08/16/16 0857 0     Pain Loc --      Pain  Edu? --      Excl. in [place]? --    No data found.   Updated Vital Signs BP 112/68 (BP Location: Left Arm)   Pulse 84   Temp 97.7 F (36.5 C) (Oral)   Ht 5\' 5"  (1.651 m)   Wt 130 lb (59 kg)   LMP 07/12/2016   SpO2 98%   BMI 21.63 kg/m   Visual Acuity Right Eye Distance:   Left Eye Distance:   Bilateral Distance:    Right Eye Near:   Left Eye Near:    Bilateral Near:     Physical Exam Nursing notes and Vital Signs reviewed. Appearance:  Patient appears stated age, and in no acute distress.    Eyes:  Pupils are equal, round, and reactive to light and accomodation.  Extraocular movement is intact.  Conjunctivae are not inflamed   Lungs: Normal respiratory rate.  Moving air well. Heart:   Normal rate. Abdomen:  Nontender without masses or hepatosplenomegaly.  Bowel sounds are present.  No CVA or flank tenderness.        UC Treatments / Results  Labs (all labs ordered are listed, but only abnormal results are displayed) Labs Reviewed - No data to display  EKG  EKG Interpretation None       Radiology No results found.  Procedures Procedures (including critical care time)  Medications Ordered in UC Medications - No data to display   Initial Impression / Assessment and Plan / UC Course  I have reviewed the triage vital signs and the nursing notes.  Pertinent labs & imaging results that were available during my care of the patient were reviewed by me and considered in my medical decision making (see chart for details).    Urine culture pending. Begin Macrobid 100mg  BID for one week. Increase fluid intake. May use non-prescription AZO for about two days, if desired, to decrease urinary discomfort.  If symptoms become significantly worse during the night or over the weekend, proceed to the local emergency room.  Followup with Family Doctor if not improved in one week.     Final Clinical Impressions(s) / UC Diagnoses   Final diagnoses:  Acute cystitis without hematuria    New Prescriptions New Prescriptions   NITROFURANTOIN, MACROCRYSTAL-MONOHYDRATE, (MACROBID) 100 MG CAPSULE    Take 1 capsule (100 mg total) by mouth 2 (two) times daily. Take with food.     [name], MD 08/16/16 [phone]

## 2016-08-16 NOTE — Discharge Instructions (Signed)
Increase fluid intake. May use non-prescription AZO for about two days, if desired, to decrease urinary discomfort.  

## 2016-08-23 ENCOUNTER — Other Ambulatory Visit: Payer: Self-pay | Admitting: Physician Assistant

## 2016-08-23 DIAGNOSIS — Z1239 Encounter for other screening for malignant neoplasm of breast: Secondary | ICD-10-CM

## 2016-08-25 ENCOUNTER — Ambulatory Visit (INDEPENDENT_AMBULATORY_CARE_PROVIDER_SITE_OTHER): Payer: BLUE CROSS/BLUE SHIELD

## 2016-08-25 DIAGNOSIS — Z1231 Encounter for screening mammogram for malignant neoplasm of breast: Secondary | ICD-10-CM

## 2016-08-25 DIAGNOSIS — Z1239 Encounter for other screening for malignant neoplasm of breast: Secondary | ICD-10-CM

## 2016-10-07 ENCOUNTER — Encounter: Payer: Self-pay | Admitting: Obstetrics & Gynecology

## 2016-10-07 ENCOUNTER — Ambulatory Visit (INDEPENDENT_AMBULATORY_CARE_PROVIDER_SITE_OTHER): Payer: BLUE CROSS/BLUE SHIELD | Admitting: Obstetrics & Gynecology

## 2016-10-07 VITALS — BP 120/64 | HR 83 | Resp 16 | Ht 64.0 in | Wt 132.0 lb

## 2016-10-07 DIAGNOSIS — Z01818 Encounter for other preprocedural examination: Secondary | ICD-10-CM

## 2016-10-07 DIAGNOSIS — N920 Excessive and frequent menstruation with regular cycle: Secondary | ICD-10-CM | POA: Diagnosis not present

## 2016-10-07 DIAGNOSIS — N926 Irregular menstruation, unspecified: Secondary | ICD-10-CM

## 2016-10-07 MED ORDER — MISOPROSTOL 200 MCG PO TABS: ORAL_TABLET | ORAL | 0 refills | Status: DC

## 2016-10-07 NOTE — Progress Notes (Signed)
   Subjective:    Patient ID: Barbara Nolan, female    DOB: May 13, 1970, 46 y.o.   MRN: [id number]  HPI  46 year old female, G2 P2002 presents for menorrhagia. Patient had a workup in her early 73s with her prior gynecologist and was scheduled for an ablation. Her gynecologist retired so she put off the surgery. Patient has still suffered with heavy monthly periods soaking pads and tampons every hour. Clots were present. Patient does not have anemia. She skips about 2 periods a year the past couple years. Other than that she is very regular with 1 heavy menstrual cycle a month. Mother had a hysterectomy in her mid 49s for fibroids. Patient does not believe she has any. Patient has some abdominal cramping with her menses as well. She is not taking any medication to help with the bleeding. She's not seeing any other physician at this time.  Review of Systems  Constitutional: Negative.   Respiratory: Negative.   Cardiovascular: Negative.   Gastrointestinal: Negative.   Genitourinary: Positive for menstrual problem and vaginal bleeding.  Psychiatric/Behavioral: Negative.        Objective:   Physical Exam  Constitutional: She is oriented to person, place, and time. She appears well-developed and well-nourished. No distress.  HENT:  Head: Normocephalic and atraumatic.  Eyes: Conjunctivae are normal.  Pulmonary/Chest: Effort normal.  Abdominal: Soft. Bowel sounds are normal. She exhibits no distension. There is no tenderness.  Musculoskeletal: She exhibits no edema.  Neurological: She is alert and oriented to person, place, and time.  Skin: Skin is warm and dry.  Psychiatric: She has a normal mood and affect.  Vitals reviewed.  Vitals:   10/07/16 0820  BP: 120/64  Pulse: 83  Resp: 16  Weight: 132 lb (59.9 kg)  Height: 5\' 4"  (1.626 m)       Assessment & Plan:  46 yo female With menorrhagia.  1. Endometrial biopsy and transvaginal ultrasound 2.  Discussed Mirena IUD, ablation,  hysterectomy. I think Mirena would be a good first line therapy. She is aware of ablation tubal ligation syndrome. An IUD would prevent that from happening. We will give Cytotec prior to the procedure. Also did endometrial biopsy at the time of IUD insertion.  30 minutes spent with patient face-to-face with greater than 50% counseling.

## 2016-10-08 ENCOUNTER — Telehealth: Payer: Self-pay | Admitting: *Deleted

## 2016-10-08 DIAGNOSIS — Z01818 Encounter for other preprocedural examination: Secondary | ICD-10-CM

## 2016-10-08 NOTE — Telephone Encounter (Signed)
-----   Message from Guss Bunde, MD sent at 10/07/2016  2:26 PM EDT ----- Will you order her the cytotec please.

## 2016-10-08 NOTE — Telephone Encounter (Signed)
Erroneous encounter

## 2016-10-22 ENCOUNTER — Ambulatory Visit (HOSPITAL_COMMUNITY)
Admission: RE | Admit: 2016-10-22 | Discharge: 2016-10-22 | Disposition: A | Discharge status: Home / Self Care | Payer: BLUE CROSS/BLUE SHIELD | Source: Ambulatory Visit | Attending: Obstetrics & Gynecology | Admitting: Obstetrics & Gynecology

## 2016-10-22 DIAGNOSIS — N926 Irregular menstruation, unspecified: Secondary | ICD-10-CM | POA: Diagnosis not present

## 2016-10-22 DIAGNOSIS — N920 Excessive and frequent menstruation with regular cycle: Secondary | ICD-10-CM | POA: Diagnosis not present

## 2016-10-28 ENCOUNTER — Ambulatory Visit: Payer: BLUE CROSS/BLUE SHIELD | Admitting: Obstetrics & Gynecology

## 2016-11-08 ENCOUNTER — Emergency Department (HOSPITAL_BASED_OUTPATIENT_CLINIC_OR_DEPARTMENT_OTHER)
Admission: EM | Admit: 2016-11-08 | Discharge: 2016-11-08 | Disposition: A | Discharge status: Home / Self Care | Payer: BLUE CROSS/BLUE SHIELD | Attending: Emergency Medicine | Admitting: Emergency Medicine

## 2016-11-08 ENCOUNTER — Encounter (HOSPITAL_BASED_OUTPATIENT_CLINIC_OR_DEPARTMENT_OTHER): Payer: Self-pay | Admitting: Emergency Medicine

## 2016-11-08 DIAGNOSIS — Z209 Contact with and (suspected) exposure to unspecified communicable disease: Secondary | ICD-10-CM

## 2016-11-08 DIAGNOSIS — Z2914 Encounter for prophylactic rabies immune globin: Secondary | ICD-10-CM | POA: Diagnosis not present

## 2016-11-08 DIAGNOSIS — Z79899 Other long term (current) drug therapy: Secondary | ICD-10-CM | POA: Insufficient documentation

## 2016-11-08 DIAGNOSIS — Z203 Contact with and (suspected) exposure to rabies: Secondary | ICD-10-CM | POA: Diagnosis not present

## 2016-11-08 LAB — PREGNANCY, URINE: PREG TEST UR: NEGATIVE

## 2016-11-08 MED ORDER — RABIES VACCINE, PCEC IM SUSR
1.0000 mL | Freq: Once | INTRAMUSCULAR | Status: AC
  Administered 2016-11-08: 1 mL via INTRAMUSCULAR
  Filled 2016-11-08: qty 1

## 2016-11-08 MED ORDER — RABIES IMMUNE GLOBULIN 150 UNIT/ML IM INJ
20.0000 [IU]/kg | INJECTION | Freq: Once | INTRAMUSCULAR | Status: AC
  Administered 2016-11-08: 1200 [IU] via INTRAMUSCULAR
  Filled 2016-11-08: qty 8

## 2016-11-08 NOTE — ED Provider Notes (Signed)
[facility] Provider Note   CSN: [id number] Arrival date & time: 11/08/16  0850     History   Chief Complaint Chief Complaint  Patient presents with  . Possible rabies exposure    HPI Barbara Nolan is a 46 y.o. female.  Patient is a 46 year old female who presents for rabies vaccines. She stepped on the back 2 days ago wearing flip-flops. That flopped around and squeals. She's not sure if the back bit her on the top of the foot. She was encouraged by her doctor to come in for rabies immunizations. She did wash her foot with soap and water after the incident but did not notice any wounds.  She denies any pain or swelling to the area.      Past Medical History:  Diagnosis Date  . Hemangioma of intracranial structure ([place])   . Hypoglycemia     Patient Active Problem List   Diagnosis Date Noted  . Menorrhagia with regular cycle 10/07/2016  . No energy 12/10/2015  . Sweaty palms 12/10/2015  . Shaky 12/10/2015  . Dizziness 12/10/2015  . Hemangioma 06/28/2014  . Shingles 06/28/2014    Past Surgical History:  Procedure Laterality Date  . CESAREAN SECTION    . TUBAL LIGATION Bilateral     OB History    Gravida Para Term Preterm AB Living   2 2 2     2    SAB TAB Ectopic Multiple Live Births           2       Home Medications    Prior to Admission medications   Medication Sig Start Date End Date Taking? Authorizing Provider  AMBULATORY NON FORMULARY MEDICATION Blood glucometer, lancets and strips to test up to twice a day for hypoglycemic events. Patient not taking: Reported on 10/07/2016 12/09/15   Iran Planas L, PA-C  Loratadine (CLARITIN PO) Take by mouth as needed.     [provider]  misoprostol (CYTOTEC) 200 MCG tablet Place 2 tablets vaginally the night prior to procedure 10/07/16   Guss Bunde, MD    Family History Family History  Problem Relation Age of Onset  . Cancer Mother        Breast CA Age 42  . Diabetes Father      Social History Social History  Substance Use Topics  . Smoking status: Never Smoker  . Smokeless tobacco: Never Used  . Alcohol use No     Allergies   Ciprofloxacin   Review of Systems Review of Systems  Constitutional: Negative for fever.  Gastrointestinal: Negative for nausea and vomiting.  Musculoskeletal: Negative for arthralgias, back pain, joint swelling and neck pain.  Skin: Negative for wound.  Neurological: Negative for weakness, numbness and headaches.     Physical Exam Updated Vital Signs BP 129/75 (BP Location: Right Arm)   Pulse 85   Temp 98.5 F (36.9 C) (Oral)   Resp 16   Ht 5\' 5"  (1.651 m)   Wt 59.9 kg (132 lb)   LMP 09/27/2016   SpO2 100%   BMI 21.97 kg/m   Physical Exam  Constitutional: She is oriented to person, place, and time. She appears well-developed and well-nourished.  HENT:  Head: Normocephalic and atraumatic.  Neck: Normal range of motion. Neck supple.  Cardiovascular: Normal rate.   Pulmonary/Chest: Effort normal.  Musculoskeletal: She exhibits no edema or tenderness.  Neurological: She is alert and oriented to person, place, and time.  Skin: Skin is warm and dry.  Psychiatric: She has a normal mood and affect.     ED Treatments / Results  Labs (all labs ordered are listed, but only abnormal results are displayed) Labs Reviewed  PREGNANCY, URINE    EKG  EKG Interpretation None       Radiology No results found.  Procedures Procedures (including critical care time)  Medications Ordered in ED Medications  rabies vaccine (RABAVERT) injection 1 mL (not administered)  rabies immune globulin (HYPERAB/KEDRAB) injection 1,200 Units (1,200 Units Intramuscular Given 11/08/16 0940)     Initial Impression / Assessment and Plan / ED Course  I have reviewed the triage vital signs and the nursing notes.  Pertinent labs & imaging results that were available during my care of the patient were reviewed by me and  considered in my medical decision making (see chart for details).     Patient presents with an exposure to a bat. She's not sure if she gotten bitten. There are no wounds noted. We'll go ahead and administer the rabies vaccine and immunoglobulin. 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  Final Clinical Impressions(s) / ED Diagnoses   Final diagnoses:  Exposure to bat without known bite    New Prescriptions New Prescriptions   No medications on file     Malvin Johns, MD 11/08/16 [phone]

## 2016-11-08 NOTE — ED Triage Notes (Signed)
Pt states she possibly stepped on a bat while wearing flip flops on Saturday. She heard a squeal. No puncture marks or pain noted. Feet were washed with soap and water after it occurred. 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

## 2016-11-08 NOTE — ED Notes (Signed)
ED Provider at bedside. 

## 2016-11-11 ENCOUNTER — Encounter: Payer: Self-pay | Admitting: *Deleted

## 2016-11-11 ENCOUNTER — Emergency Department
Admission: EM | Admit: 2016-11-11 | Discharge: 2016-11-11 | Disposition: A | Discharge status: Home / Self Care | Payer: BLUE CROSS/BLUE SHIELD | Source: Home / Self Care

## 2016-11-11 DIAGNOSIS — Z23 Encounter for immunization: Secondary | ICD-10-CM | POA: Diagnosis not present

## 2016-11-11 MED ORDER — RABIES VACCINE, PCEC IM SUSR
1.0000 mL | Freq: Once | INTRAMUSCULAR | Status: AC
  Administered 2016-11-11: 1 mL via INTRAMUSCULAR

## 2016-11-11 NOTE — ED Triage Notes (Signed)
Patient is here for 1st injection in rabies series after initial.

## 2016-11-15 ENCOUNTER — Ambulatory Visit (INDEPENDENT_AMBULATORY_CARE_PROVIDER_SITE_OTHER): Payer: BLUE CROSS/BLUE SHIELD | Admitting: Obstetrics & Gynecology

## 2016-11-15 ENCOUNTER — Emergency Department
Admission: EM | Admit: 2016-11-15 | Discharge: 2016-11-15 | Disposition: A | Discharge status: Home / Self Care | Payer: BLUE CROSS/BLUE SHIELD | Source: Home / Self Care

## 2016-11-15 ENCOUNTER — Encounter: Payer: Self-pay | Admitting: Obstetrics & Gynecology

## 2016-11-15 VITALS — BP 112/69 | HR 81 | Resp 16 | Ht 64.0 in | Wt 132.0 lb

## 2016-11-15 DIAGNOSIS — Z23 Encounter for immunization: Secondary | ICD-10-CM | POA: Diagnosis not present

## 2016-11-15 DIAGNOSIS — Z3202 Encounter for pregnancy test, result negative: Secondary | ICD-10-CM | POA: Diagnosis not present

## 2016-11-15 DIAGNOSIS — N920 Excessive and frequent menstruation with regular cycle: Secondary | ICD-10-CM | POA: Diagnosis not present

## 2016-11-15 DIAGNOSIS — N939 Abnormal uterine and vaginal bleeding, unspecified: Secondary | ICD-10-CM | POA: Diagnosis not present

## 2016-11-15 DIAGNOSIS — Z203 Contact with and (suspected) exposure to rabies: Secondary | ICD-10-CM

## 2016-11-15 LAB — POCT URINE PREGNANCY: Preg Test, Ur: NEGATIVE

## 2016-11-15 MED ORDER — RABIES VACCINE, PCEC IM SUSR
1.0000 mL | Freq: Once | INTRAMUSCULAR | Status: AC
  Administered 2016-11-15: 1 mL via INTRAMUSCULAR

## 2016-11-15 NOTE — ED Triage Notes (Signed)
Patient is here for 2nd vaccine in Rabies series.

## 2016-11-15 NOTE — Progress Notes (Signed)
ENDOMETRIAL BIOPSY     The indications for endometrial biopsy were reviewed.   Risks of the biopsy including cramping, bleeding, infection, uterine perforation, inadequate specimen and need for additional procedures  were discussed. The patient states she understands and agrees to undergo procedure today. Consent was signed. Time out was performed. Urine HCG was negative. A sterile speculum was placed in the patient's vagina and the cervix was prepped with Betadine. A single-toothed tenaculum was placed on the anterior lip of the cervix to stabilize it. The 3 mm pipelle was introduced into the endometrial cavity without difficulty to a depth of 8 cm, and a moderate amount of tissue was obtained and sent to pathology. The instruments were removed from the patient's vagina. Minimal bleeding from the cervix was noted. The patient tolerated the procedure well. Routine post-procedure instructions were given to the patient. The patient will follow up to review the results and for further management.    Pt unsure about IUD.  Will come back if she desires treatment.

## 2016-11-22 ENCOUNTER — Telehealth: Payer: Self-pay | Admitting: *Deleted

## 2016-11-22 ENCOUNTER — Encounter: Payer: Self-pay | Admitting: *Deleted

## 2016-11-22 ENCOUNTER — Emergency Department
Admission: EM | Admit: 2016-11-22 | Discharge: 2016-11-22 | Disposition: A | Discharge status: Home / Self Care | Payer: BLUE CROSS/BLUE SHIELD | Source: Home / Self Care

## 2016-11-22 DIAGNOSIS — Z23 Encounter for immunization: Secondary | ICD-10-CM | POA: Diagnosis not present

## 2016-11-22 MED ORDER — RABIES VACCINE, PCEC IM SUSR
1.0000 mL | Freq: Once | INTRAMUSCULAR | Status: AC
  Administered 2016-11-22: 1 mL via INTRAMUSCULAR

## 2016-11-22 NOTE — Telephone Encounter (Signed)
-----   Message from Guss Bunde, MD sent at 11/19/2016  9:55 AM EDT ----- Biopsy negative for hyperplasia or malignancy.  Follow up as needed.

## 2016-11-22 NOTE — ED Triage Notes (Signed)
Pt is here today for her 3rd rabies vaccine.

## 2016-11-22 NOTE — Telephone Encounter (Signed)
LM on voicemail of neg biopsy and f/u PRN

## 2016-12-13 ENCOUNTER — Encounter: Payer: Self-pay | Admitting: Family Medicine

## 2016-12-13 ENCOUNTER — Ambulatory Visit: Payer: BLUE CROSS/BLUE SHIELD | Admitting: Family Medicine

## 2016-12-13 DIAGNOSIS — M5412 Radiculopathy, cervical region: Secondary | ICD-10-CM

## 2016-12-13 MED ORDER — GABAPENTIN 300 MG PO CAPS: ORAL_CAPSULE | ORAL | 3 refills | Status: DC

## 2016-12-13 MED ORDER — PREDNISONE 5 MG (48) PO TBPK: ORAL_TABLET | ORAL | 0 refills | Status: DC

## 2016-12-13 NOTE — Progress Notes (Signed)
Barbara Nolan is a 46 y.o. female who presents to [place] today for 2 weeks history of worsening right shoulder pain. Patient first noted pain 2 weeks ago, stating she slept on her shoulder wrong. Managed pain for about a week with ibuprofen and hot packs. 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   Patient states pain is localized one spot over her right scapula. She describes it as someone "holding a match" over her scapula. Exertion and even sitting at desk for extended periods of time makes pain worse. Patient notes that she also has an aching pain that extends down the dorsal aspect of her arm and extensor surface of her forearm. Occasionally, she has tingling extending into her 4th and 5th digits as well. She is able to reach over her head without difficulty and does not endorse any weakness. Denies pain in rotator cuff or shoulder area.   Patient denies any preceding trauma before onset of pain. She is taking up to 1600 mg a day ibuprofen, which provides some relief. Also using hot packs occasionally.  She denies any recent illness. Denies fever, chills, nausea/vomiting. Denies erythema, swelling, or effusion of shoulder. Denies noticing any winging of scapula.   Past Medical History:  Diagnosis Date  . Hemangioma of intracranial structure ([place])   . Hypoglycemia    Past Surgical History:  Procedure Laterality Date  . CESAREAN SECTION    . TUBAL LIGATION Bilateral    Social History   Tobacco Use  . Smoking status: Never Smoker  . Smokeless tobacco: Never Used  Substance Use Topics  . Alcohol use: No    Alcohol/week: 0.0 oz    ROS:  As above  Medications: Current Outpatient Medications  Medication Sig Dispense Refill  . AMBULATORY NON FORMULARY MEDICATION Blood glucometer, lancets and strips to test up to twice a day for hypoglycemic events. 100 strip 0  . Loratadine (CLARITIN  PO) Take by mouth as needed.     . gabapentin (NEURONTIN) 300 MG capsule One tab PO qHS for a week, then BID for a week, then TID. May double weekly to a max of 3,600mg /day 180 capsule 3  . predniSONE (STERAPRED UNI-PAK 48 TAB) 5 MG (48) TBPK tablet 12 day dosepack po 48 tablet 0   No current facility-administered medications for this visit.    Allergies  Allergen Reactions  . Ciprofloxacin    Exam:  BP 116/79   Pulse 93   Wt 131 lb (59.4 kg)   SpO2 98%   BMI 22.49 kg/m  General: Well Developed, well nourished, and in no acute distress.  Neuro/Psych: Alert and oriented x3, extra-ocular muscles intact, able to move all 4 extremities, sensation grossly intact. Skin: Warm and dry, no rashes noted.  Respiratory: Not using accessory muscles, speaking in full sentences, trachea midline.  Cardiovascular: Pulses palpable, no extremity edema. Abdomen: Does not appear distended. Back: no point tenderness over cervical or thoracic spine. Spine straight without step-off. Neck with full range of motion without pain.  MSK:  R. Shoulder:  Grossly normal with no superficial erythema, swelling, or effusion. No winging of scapula. Point tender over right scapula and along medial border of scapular. No tenderness in shoulder or along joint space. Strength 5/5 in bilateral upper extremities.  Sensation grossly intact in bilateral upper extremities. Full range of active and passive motion with pain at 120 degrees.  Empty can negative,  Hawkins and Neer's  test negative Crossover arm compression test negative.   No results found for this or any previous visit (from the past 48 hour(s)). No results found.  Assessment and Plan: 46 y.o. female with subscapularis/rhomboid spacticity vs C8-T1 radiculopathy due to joint space narrowing secondary to cervical osteoarthritis or a bulging cervical disc. Patient with point tenderness over scapula, but without exam findings typical for subscapularis/rhomboid  involvement. Nerve root impingement also possible, especially given radiation through C8 distribution.   We will treat for muscular weakness/spasticity with physical therapy, TENS unit, and heating packs. Also prescribed gabapentin to be taken at night for spacticity. At this time, there is not concern for an acute vertebral joint space narrowing or fracture, so will defer Xrays to after trial of physical therapy. If patient does not improve over the next 4 weeks, we will likely pursue xray followed by MRI. Due to concern for disc herniation, we will treat empirically with a short course of oral prednisone. Patient counseled on risks and benefits of prednisone. Patient amenable to trial concurrent with physical therapy.   Patient will follow up in 4 weeks.  Orders Placed This Encounter  Procedures  . Ambulatory referral to Physical Therapy    Referral Priority:   Routine    Referral Type:   Physical Medicine    Referral Reason:   Specialty Services Required    Requested Specialty:   Physical Therapy   Meds ordered this encounter  Medications  . predniSONE (STERAPRED UNI-PAK 48 TAB) 5 MG (48) TBPK tablet    Sig: 12 day dosepack po    Dispense:  48 tablet    Refill:  0  . gabapentin (NEURONTIN) 300 MG capsule    Sig: One tab PO qHS for a week, then BID for a week, then TID. May double weekly to a max of 3,600mg /day    Dispense:  180 capsule    Refill:  3    Discussed warning signs or symptoms. Please see discharge instructions. Patient expresses understanding.

## 2016-12-13 NOTE — Patient Instructions (Signed)
Thank you for coming in today. Attend PT.  Take prednisone and gabapentin.  Recheck in 4 weeks.  Use a heating pad and TENS unit.   TENS UNIT: This is helpful for muscle pain and spasm.   Search and Purchase a TENS 7000 2nd edition at  www.tenspros.com or www.[employer].com It should be less than $30.     TENS unit instructions: Do not shower or bathe with the unit on Turn the unit off before removing electrodes or batteries If the electrodes lose stickiness add a drop of water to the electrodes after they are disconnected from the unit and place on plastic sheet. If you continued to have difficulty, call the TENS unit company to purchase more electrodes. Do not apply lotion on the skin area prior to use. Make sure the skin is clean and dry as this will help prolong the life of the electrodes. After use, always check skin for unusual red areas, rash or other skin difficulties. If there are any skin problems, does not apply electrodes to the same area. Never remove the electrodes from the unit by pulling the wires. Do not use the TENS unit or electrodes other than as directed. Do not change electrode placement without consultating your therapist or physician. Keep 2 fingers with between each electrode. Wear time ratio is 2:1, on to off times.    For example on for 30 minutes off for 15 minutes and then on for 30 minutes off for 15 minutes    Cervical Radiculopathy Cervical radiculopathy means that a nerve in the neck is pinched or bruised. This can cause pain or loss of feeling (numbness) that runs from your neck to your arm and fingers. Follow these instructions at home: Managing pain  Take over-the-counter and prescription medicines only as told by your doctor.  If directed, put ice on the injured or painful area. ? Put ice in a plastic bag. ? Place a towel between your skin and the bag. ? Leave the ice on for 20 minutes, 2-3 times per day.  If ice does not help, you can try  using heat. Take a warm shower or warm bath, or use a heat pack as told by your doctor.  You may try a gentle neck and shoulder massage. Activity  Rest as needed. Follow instructions from your doctor about any activities to avoid.  Do exercises as told by your doctor or physical therapist. General instructions  If you were given a soft collar, wear it as told by your doctor.  Use a flat pillow when you sleep.  Keep all follow-up visits as told by your doctor. This is important. Contact a doctor if:  Your condition does not improve with treatment. Get help right away if:  Your pain gets worse and is not controlled with medicine.  You lose feeling or feel weak in your hand, arm, face, or leg.  You have a fever.  You have a stiff neck.  You cannot control when you poop or pee (have incontinence).  You have trouble with walking, balance, or talking. This information is not intended to replace advice given to you by your health care provider. Make sure you discuss any questions you have with your health care provider. Document Released: 01/07/2011 Document Revised: 06/26/2015 Document Reviewed: 03/14/2014 Elsevier Interactive Patient Education  [name].

## 2016-12-15 ENCOUNTER — Encounter: Payer: Self-pay | Admitting: Rehabilitative and Restorative Service Providers"

## 2016-12-15 ENCOUNTER — Other Ambulatory Visit: Payer: Self-pay

## 2016-12-15 ENCOUNTER — Ambulatory Visit (INDEPENDENT_AMBULATORY_CARE_PROVIDER_SITE_OTHER): Payer: BLUE CROSS/BLUE SHIELD | Admitting: Rehabilitative and Restorative Service Providers"

## 2016-12-15 DIAGNOSIS — R29898 Other symptoms and signs involving the musculoskeletal system: Secondary | ICD-10-CM | POA: Diagnosis not present

## 2016-12-15 DIAGNOSIS — R293 Abnormal posture: Secondary | ICD-10-CM

## 2016-12-15 DIAGNOSIS — M542 Cervicalgia: Secondary | ICD-10-CM | POA: Diagnosis not present

## 2016-12-15 NOTE — Therapy (Signed)
[place], [place], [address] Phone: [phone]   Fax:  [phone]  Physical Therapy Evaluation  Patient Details  Name: Barbara Nolan MRN: [id number] Date of Birth: [birth year] Referring Provider: Dr Georgina Snell    Encounter Date: 12/15/2016  PT End of Session - 12/15/16 1023    Visit Number  1    Number of Visits  12    Date for PT Re-Evaluation  01/26/17    Authorization - Visit Number  [id number]    [id number] - Number of Visits  1129    Activity Tolerance  Patient tolerated treatment well       Past Medical History:  Diagnosis Date  . Hemangioma of intracranial structure ([place])   . Hypoglycemia     Past Surgical History:  Procedure Laterality Date  . CESAREAN SECTION    . TUBAL LIGATION Bilateral     There were no vitals filed for this visit.   Subjective Assessment - 12/15/16 1023    Subjective  Patient reports onset of Rt shoudler pain 11/25/16 whe she awoke with Rt shoulder pain. She treated with rest and OTC antiinflammatory with some improvement some increase in symptoms noted this week. She continues to have increase in symtpoms on an intermittent basis.     Pertinent History  denies any prior musculoskeletal problems     Patient Stated Goals  get rid pain     Currently in Pain?  Yes    Pain Score  2     Pain Location  Shoulder    Pain Orientation  Right    Pain Descriptors / Indicators  Aching;Nagging    Pain Type  Acute pain    Pain Radiating Towards  posterior arm and lateral forearm into the ring and little fingers on an intermittent basis - present with pain     Pain Onset  1 to 4 weeks ago    Pain Frequency  Intermittent    Aggravating Factors   unknown; use of mouse at computer     Pain Relieving Factors  OTC meds; heat          [facility] PT Assessment - 12/15/16 0001      Assessment   Medical Diagnosis  Rt shoulder dysfunction    Referring Provider  Dr Georgina Snell     Onset  Date/Surgical Date  11/25/16    Hand Dominance  Right    Next MD Visit  PRN    Prior Therapy  none       Precautions   Precautions  None      Balance Screen   Has the patient fallen in the past 6 months  No    Has the patient had a decrease in activity level because of a fear of falling?   No    Is the patient reluctant to leave their home because of a fear of falling?   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     Leisure  household chores; sheep farm;       Observation/Other Assessments   Focus on Therapeutic Outcomes (FOTO)   63% limitation       Sensation   Additional Comments  intermittent tingling Rt posterior arm, lateral forearm; ring and little fingers  Posture/Postural Control   Posture Comments  head forward; shoudlers rounded and elevated; scapulae abducted and rotated along the thoracic wall Rt shoulder anterior compared to Lt       AROM   Right Shoulder Internal Rotation  -- functional IR to ~ T7/8 w/ pain     Left Shoulder Internal Rotation  -- funcitoinal IR to T 3/4 no pain     Cervical Flexion  60    Cervical Extension  52    Cervical - Right Side Bend  40    Cervical - Left Side Bend  42    Cervical - Right Rotation  70    Cervical - Left Rotation  84      Strength   Overall Strength Comments  5/5 bilat UE's       Palpation   Spinal mobility  hypomobile thoracic and cervical spine with CPA and lateral mobs greatest at C3/4/5 and C6/7 to upper thoracic     Palpation comment  tight bilat ant/lat/post cervical musculature; upper traps; leveator; pecs Rt >> Lt              Objective measurements completed on examination: See above findings.      [facility] Adult PT Treatment/Exercise - 12/15/16 0001      Therapeutic Activites    Therapeutic Activities  -- myofacial ball release  work thoracic and pec musculature       Neuro Re-ed    Neuro Re-ed Details   postural correction       Neck Exercises: Standing   Other Standing Exercises  scap squeeze 10 sec x 5 with noodle; L's x 5; W's x 5       Shoulder Exercises: Stretch   Other Shoulder Stretches  3 way doorway stretch 30 sec x 2 each position - note some reproduction of posterior shoulder pain with lower pec stretch which decresaed as stretch was repeated       Moist Heat Therapy   Number Minutes Moist Heat  20 Minutes    Moist Heat Location  Cervical;Shoulder Rt shoulder girdle       Electrical Stimulation   Electrical Stimulation Location  Rt scapula; lateral cervical; upper trap; Pensions consultant Action  IFC    Electrical Stimulation Parameters  to tolerance    Electrical Stimulation Goals  Pain;Tone muscular tightness       Manual Therapy   Manual therapy comments  pt supine     Joint Mobilization  cervical and thoracic CPA and lateral glides     Soft tissue mobilization  deep tissue work ant/lat/posterior cervical; upper traps; pecs Rt > Lt     Myofascial Release  cervical and pecs     Scapular Mobilization  Rt     Passive ROM  cervical flexion; flexion with slight rotation    Manual Traction  cervical 20-30 in 4-5 reps during manual work       Neck Exercises: Stretches   Other Neck Stretches  axial extension 10 sec x 5 pt supine              PT Education - 12/15/16 1106    Education provided  Yes    Education Details  HEP TENS postural correction     Person(s) Educated  Patient    Methods  Explanation;Demonstration;Tactile cues;Verbal cues;Handout    Comprehension  Verbalized understanding;Returned demonstration;Verbal cues required;Tactile cues required          PT Long Term Goals -  12/15/16 1124      PT LONG TERM GOAL #1   Title  Decrease Rt shoulder pain and radicular symptoms by 75-100% allowing patient to return to normal functional activities without pain or  limitation 01/26/17    Time  6    Period  Weeks    Status  New      PT LONG TERM GOAL #2   Title  Improve posture and alignment with patient to demonstrate upright posture with posterior shoulder girdle musculature engaged 01/26/17    Time  6    Period  Weeks    Status  New      PT LONG TERM GOAL #3   Title  Increase cervical ROM in lateral flexion and rotation to WFL's and ~ equal bilateral 01/26/17    Time  6    Period  Weeks    Status  New      PT LONG TERM GOAL #4   Title  Independent in HEP 01/26/17    Time  6    Period  Weeks    Status  New      PT LONG TERM GOAL #5   Title  Improve FOTO to </= 33% limitation 01/26/17    Time  6    Period  Weeks    Status  New             Plan - 12/15/16 1129    Clinical Impression Statement  Chenell presents with 3-4 week history of Rt shoulder pain with intermittent radicular Rt UE pain. She has poor posture and alignment; limited cervical mobility and Rt shoulder functional IR; significant muscular tightness through Rt > Lt cervical and upper quarter musculature. Symptoms were decreased with passive cervical stretching and pec stretch through the doorway suggesting some component of myofacial dysfunction involving the cervical and thoracic musculature. Charlean will benefit form PT to address problems identified.     Clinical Presentation  Evolving    Clinical Decision Making  Low    Rehab Potential  Good    PT Frequency  2x / week    PT Duration  6 weeks    PT Treatment/Interventions  Patient/family education;ADLs/Self Care Home Management;Cryotherapy;Electrical Stimulation;Iontophoresis 4mg /ml Dexamethasone;Moist Heat;Ultrasound;Traction;Dry needling;Manual techniques;Neuromuscular re-education;Therapeutic activities;Therapeutic exercise    PT Next Visit Plan  review HEP; assess response to manual work and stretching; progress stretching for pecs and cervical musculature; manual work through the anterior chest; cervical muscuature;  modalities as indicated     Consulted and Agree with Plan of Care  Patient       Patient will benefit from skilled therapeutic intervention in order to improve the following deficits and impairments:  Postural dysfunction, Improper body mechanics, Pain, Increased fascial restricitons, Increased muscle spasms, Hypomobility, Decreased mobility, Decreased activity tolerance  Visit Diagnosis: Cervicalgia - Plan: PT plan of care cert/re-cert  Other symptoms and signs involving the musculoskeletal system - Plan: PT plan of care cert/re-cert  Abnormal posture - Plan: PT plan of care cert/re-cert     Problem List Patient Active Problem List   Diagnosis Date Noted  . Cervical radiculopathy at C8 12/13/2016  . Menorrhagia with regular cycle 10/07/2016  . No energy 12/10/2015  . Sweaty palms 12/10/2015  . Shaky 12/10/2015  . Dizziness 12/10/2015  . Hemangioma 06/28/2014  . Shingles 06/28/2014    Jaramiah Bossard Nilda Simmer PT, MPH  12/15/2016, 11:42 AM  [facility] [place], [place], [address] Phone: [phone]  Fax:  [phone]  Name: Maralyn Witherell MRN: [id number] Date of Birth: Apr 19, 1970

## 2016-12-15 NOTE — Patient Instructions (Addendum)
Axial Extension (Chin Tuck)    Pull chin in and lengthen back of neck. Hold __5__ seconds while counting out loud. Repeat __10__ times. Do __several__ sessions per day.  Shoulder Blade Squeeze    Rotate shoulders back, then squeeze shoulder blades down and back Hold 10 reps Repeat __10__ times. Do ___several _ sessions per day.  Upper Back Strength: Lower Trapezius / Rotator Cuff " L's "     Arms in waitress pose, palms up. Press hands back and slide shoulder blades down. Hold for __5__ seconds. Repeat _10___ times. 1-2 times per day.    Scapular Retraction: Elbow Flexion (Standing)  "W's"     With elbows bent to 90, pinch shoulder blades together and rotate arms out, keeping elbows bent. Repeat __10__ times per set. Do __1-2__ sets per session. Do _several ___ sessions per day.  Scapula Adduction With Pectoralis Stretch: Low - Standing   Shoulders at 45 hands even with shoulders, keeping weight through legs, shift weight forward until you feel pull or stretch through the front of your chest. Hold _30__ seconds. Do _3__ times, _2-4__ times per day.   Scapula Adduction With Pectoralis Stretch: Mid-Range - Standing   Shoulders at 90 elbows even with shoulders, keeping weight through legs, shift weight forward until you feel pull or strength through the front of your chest. Hold __30_ seconds. Do _3__ times, __2-4_ times per day.   Scapula Adduction With Pectoralis Stretch: High - Standing   Shoulders at 120 hands up high on the doorway, keeping weight on feet, shift weight forward until you feel pull or stretch through the front of your chest. Hold _30__ seconds. Do _3__ times, _2-3__ times per day.     SUPINE Tips A    Being in the supine position means to be lying on the back. Lying on the back is the position of least compression on the bones and discs of the spine, and helps to re-align the natural curves of the back. 2-3 min working toward 5 min  Can  bend elbows to release stretch a few seconds as needed  Arms out at side ~ 80-90 deg   Ball release work with ~ 3 inch plastic ball   TENS UNIT: This is helpful for muscle pain and spasm.   Search and Purchase a TENS 7000 2nd edition at www.tenspros.com. It should be less than $30.     TENS unit instructions: Do not shower or bathe with the unit on Turn the unit off before removing electrodes or batteries If the electrodes lose stickiness add a drop of water to the electrodes after they are disconnected from the unit and place on plastic sheet. If you continued to have difficulty, call the TENS unit company to purchase more electrodes. Do not apply lotion on the skin area prior to use. Make sure the skin is clean and dry as this will help prolong the life of the electrodes. After use, always check skin for unusual red areas, rash or other skin difficulties. If there are any skin problems, does not apply electrodes to the same area. Never remove the electrodes from the unit by pulling the wires. Do not use the TENS unit or electrodes other than as directed. Do not change electrode placement without consultating your therapist or physician. Keep 2 fingers with between each electrode.

## 2016-12-17 ENCOUNTER — Ambulatory Visit: Payer: BLUE CROSS/BLUE SHIELD | Admitting: Rehabilitative and Restorative Service Providers"

## 2016-12-17 ENCOUNTER — Other Ambulatory Visit: Payer: Self-pay

## 2016-12-17 ENCOUNTER — Encounter: Payer: Self-pay | Admitting: Rehabilitative and Restorative Service Providers"

## 2016-12-17 DIAGNOSIS — R29898 Other symptoms and signs involving the musculoskeletal system: Secondary | ICD-10-CM

## 2016-12-17 DIAGNOSIS — R293 Abnormal posture: Secondary | ICD-10-CM | POA: Diagnosis not present

## 2016-12-17 DIAGNOSIS — M542 Cervicalgia: Secondary | ICD-10-CM

## 2016-12-17 NOTE — Therapy (Signed)
[place], [place], [address] Phone: [phone]   Fax:  [phone]  Physical Therapy Treatment  Patient Details  Name: Barbara Nolan MRN: [id number] Date of Birth: September 05, 1970 Referring Provider: Dr Georgina Snell    Encounter Date: 12/17/2016  PT End of Session - 12/17/16 1340    Visit Number  2    Number of Visits  12    Date for PT Re-Evaluation  01/26/17    Authorization - Number of Visits  1518    PT Start Time  2831    PT Stop Time  1518    PT Time Calculation (min)  45 min    Activity Tolerance  Patient tolerated treatment well       Past Medical History:  Diagnosis Date  . Hemangioma of intracranial structure ([place])   . Hypoglycemia     Past Surgical History:  Procedure Laterality Date  . CESAREAN SECTION    . TUBAL LIGATION Bilateral     There were no vitals filed for this visit.                   [facility] Adult PT Treatment/Exercise - 12/17/16 0001      Neck Exercises: Standing   Other Standing Exercises  scap squeeze 10 sec x 5 with noodle; L's x 5; W's x 5       Shoulder Exercises: Stretch   Other Shoulder Stretches  3 way doorway stretch 30 sec x 2 each position - note some reproduction of posterior shoulder pain with lower pec stretch which decresaed as stretch was repeated       Moist Heat Therapy   Number Minutes Moist Heat  20 Minutes    Moist Heat Location  Cervical;Shoulder Rt shoulder girdle       Electrical Stimulation   Electrical Stimulation Location  Rt scapula; lateral cervical; upper trap; pecs    Electrical Stimulation Action  IFC    Electrical Stimulation Parameters  to tolerance    Electrical Stimulation Goals  Pain;Tone muscular tightness       Manual Therapy   Manual therapy comments  pt supine     Joint Mobilization  cervical and thoracic CPA and lateral glides     Soft tissue mobilization  deep tissue work ant/lat/posterior cervical; upper  traps; pecs Rt > Lt     Myofascial Release  cervical and pecs     Scapular Mobilization  Rt     Passive ROM  cervical flexion; flexion with slight rotation    Manual Traction  cervical 20-30 in 4-5 reps during manual work       Neck Exercises: Stretches   Other Neck Stretches  axial extension 10 sec x 5 pt supine                   PT Long Term Goals - 12/15/16 1124      PT LONG TERM GOAL #1   Title  Decrease Rt shoulder pain and radicular symptoms by 75-100% allowing patient to return to normal functional activities without pain or limitation 01/26/17    Time  6    Period  Weeks    Status  New      PT LONG TERM GOAL #2   Title  Improve posture and alignment with patient to demonstrate upright posture with posterior shoulder girdle musculature engaged 01/26/17    Time  6    Period  Weeks    Status  New      PT LONG TERM GOAL #3   Title  Increase cervical ROM in lateral flexion and rotation to WFL's and ~ equal bilateral 01/26/17    Time  6    Period  Weeks    Status  New      PT LONG TERM GOAL #4   Title  Independent in HEP 01/26/17    Time  6    Period  Weeks    Status  New      PT LONG TERM GOAL #5   Title  Improve FOTO to </= 33% limitation 01/26/17    Time  6    Period  Weeks    Status  New            Plan - 12/17/16 1401    Clinical Impression Statement  Good response to initial treatment and HEP. Will benefit from continued therapy to acomplish goals,     Rehab Potential  Good    PT Frequency  2x / week    PT Duration  6 weeks    PT Treatment/Interventions  Patient/family education;ADLs/Self Care Home Management;Cryotherapy;Electrical Stimulation;Iontophoresis 4mg /ml Dexamethasone;Moist Heat;Ultrasound;Traction;Dry needling;Manual techniques;Neuromuscular re-education;Therapeutic activities;Therapeutic exercise    PT Next Visit Plan  review HEP; assess response to manual work and stretching; progress stretching for pecs and cervical musculature;  manual work through the anterior chest; cervical muscuature; modalities as indicated     Consulted and Agree with Plan of Care  Patient       Patient will benefit from skilled therapeutic intervention in order to improve the following deficits and impairments:  Postural dysfunction, Improper body mechanics, Pain, Increased fascial restricitons, Increased muscle spasms, Hypomobility, Decreased mobility, Decreased activity tolerance  Visit Diagnosis: Cervicalgia  Other symptoms and signs involving the musculoskeletal system  Abnormal posture     Problem List Patient Active Problem List   Diagnosis Date Noted  . Cervical radiculopathy at C8 12/13/2016  . Menorrhagia with regular cycle 10/07/2016  . No energy 12/10/2015  . Sweaty palms 12/10/2015  . Shaky 12/10/2015  . Dizziness 12/10/2015  . Hemangioma 06/28/2014  . Shingles 06/28/2014    Aadith Raudenbush Nilda Simmer PT, MPH  12/17/2016, 2:03 PM  [facility] [place], [place], [address] Phone: [phone]   Fax:  [phone]  Name: Thomasa Heidler MRN: [id number] Date of Birth: Jan 18, 1971

## 2016-12-20 ENCOUNTER — Encounter: Payer: Self-pay | Admitting: Rehabilitative and Restorative Service Providers"

## 2016-12-20 ENCOUNTER — Ambulatory Visit: Payer: BLUE CROSS/BLUE SHIELD | Admitting: Rehabilitative and Restorative Service Providers"

## 2016-12-20 DIAGNOSIS — M542 Cervicalgia: Secondary | ICD-10-CM

## 2016-12-20 DIAGNOSIS — R29898 Other symptoms and signs involving the musculoskeletal system: Secondary | ICD-10-CM | POA: Diagnosis not present

## 2016-12-20 DIAGNOSIS — R293 Abnormal posture: Secondary | ICD-10-CM

## 2016-12-20 NOTE — Patient Instructions (Signed)
Resisted External Rotation: in Neutral - Bilateral   PALMS UP Sit or stand, tubing in both hands, elbows at sides, bent to 90, forearms forward. Pinch shoulder blades together and rotate forearms out. Keep elbows at sides. Repeat __10__ times per set. Do _2-3___ sets per session. Do _several ___ sessions per day.   Low Row: Standing   Face anchor, feet shoulder width apart. Palms up, pull arms back, squeezing shoulder blades together. Repeat 10__ times per set. Do 2-3__ sets per session. Do 1-2__ sessions per day Anchor Height: Waist    Strengthening: Resisted Extension   Hold tubing in right hand, arm forward. Pull arm back, elbow straight. Repeat _10___ times per set. Do 2-3____ sets per session. Do 1-2___ sessions per day.

## 2016-12-20 NOTE — Therapy (Addendum)
[place] [facility] [place], [place], [address] Phone: [phone]   Fax:  [phone]  Physical Therapy Treatment  Patient Details  Name: Barbara Nolan MRN: [id number] Date of Birth: 1970-03-05 Referring Provider: Dr Georgina Snell    Encounter Date: 12/20/2016  PT End of Session - 12/20/16 0937    Visit Number  3    Number of Visits  12    Date for PT Re-Evaluation  01/26/17    PT Start Time  0924    PT Stop Time  1022    PT Time Calculation (min)  58 min    Activity Tolerance  Patient tolerated treatment well       Past Medical History:  Diagnosis Date  . Hemangioma of intracranial structure ([place])   . Hypoglycemia     Past Surgical History:  Procedure Laterality Date  . CESAREAN SECTION    . TUBAL LIGATION Bilateral     There were no vitals filed for this visit.      [facility] PT Assessment - 12/20/16 0001      Assessment   Medical Diagnosis  Rt shoulder dysfunction    Referring Provider  Dr Georgina Snell     Onset Date/Surgical Date  11/25/16    Hand Dominance  Right    Next MD Visit  PRN    Prior Therapy  none       Palpation   Spinal mobility  good inprovement in mobility through thoracic and cervical spine with CPA and lateral mobs greatest at C3/4/5 and C6/7 to upper thoracic     Palpation comment  decresaed tightness bilat ant/lat/post cervical musculature; upper traps; leveator; pecs Rt >> Lt                   [facility] Adult PT Treatment/Exercise - 12/20/16 0001      Neck Exercises: Standing   Other Standing Exercises  scap squeeze 10 sec x 5 with noodle; L's x 5; W's x 5       Shoulder Exercises: Standing   Extension  Strengthening;Both;15 reps;Theraband    Theraband Level (Shoulder Extension)  Level 2 (Red)    Row  Strengthening;Both;15 reps;Theraband    Theraband Level (Shoulder Row)  Level 2 (Red)    Retraction  Strengthening;Both;20 reps;Theraband    Theraband Level (Shoulder  Retraction)  Level 2 (Red)      Shoulder Exercises: Stretch   Other Shoulder Stretches  3 way doorway stretch 30 sec x 2 each position - note some reproduction of posterior shoulder pain with lower pec stretch which decresaed as stretch was repeated       Moist Heat Therapy   Number Minutes Moist Heat  20 Minutes    Moist Heat Location  Cervical;Shoulder Rt shoulder girdle       Electrical Stimulation   Electrical Stimulation Location  Rt scapula; lateral cervical; upper trap    Electrical Stimulation Action  IFC    Electrical Stimulation Parameters  to tolerance    Electrical Stimulation Goals  Pain;Tone muscular tightness       Manual Therapy   Manual therapy comments  pt supine     Joint Mobilization  cervical and thoracic CPA and lateral glides     Soft tissue mobilization  deep tissue work ant/lat/posterior cervical; upper traps; pecs Rt > Lt     Myofascial Release  cervical and pecs     Passive ROM  cervical flexion; flexion with slight rotation  Manual Traction  cervical 20-30 in 4-5 reps during manual work       Neck Exercises: Stretches   Other Neck Stretches  axial extension 10 sec x 5 pt supine              PT Education - 12/20/16 0949    Education provided  Yes    Education Details  HEP     Person(s) Educated  Patient    Methods  Explanation;Demonstration;Tactile cues;Verbal cues;Handout    Comprehension  Verbalized understanding;Returned demonstration;Verbal cues required          PT Long Term Goals - 12/20/16 0938      PT LONG TERM GOAL #1   Title  Decrease Rt shoulder pain and radicular symptoms by 75-100% allowing patient to return to normal functional activities without pain or limitation 01/26/17    Time  6    Period  Weeks    Status  On-going      PT LONG TERM GOAL #2   Title  Improve posture and alignment with patient to demonstrate upright posture with posterior shoulder girdle musculature engaged 01/26/17    Time  6    Period  Weeks     Status  On-going      PT LONG TERM GOAL #3   Title  Increase cervical ROM in lateral flexion and rotation to WFL's and ~ equal bilateral 01/26/17    Time  6    Period  Weeks    Status  On-going      PT LONG TERM GOAL #4   Title  Independent in HEP 01/26/17    Time  6    Period  Weeks    Status  On-going      PT LONG TERM GOAL #5   Title  Improve FOTO to </= 33% limitation 01/26/17    Time  6    Period  Weeks    Status  On-going            Plan - 12/20/16 0939    Clinical Impression Statement  Progressing well with decreased pain and radicular symptoms. Working on her exercises at home. Can tell the doorway stretch is getting easier. Less palpable tightness noted through Rt upper quarter and improve spinal mobility through the thoracic and cervical spine. Progressing well toward stated goals of therapy.     Rehab Potential  Good    PT Frequency  2x / week    PT Duration  6 weeks    PT Treatment/Interventions  Patient/family education;ADLs/Self Care Home Management;Cryotherapy;Electrical Stimulation;Iontophoresis 22m/ml Dexamethasone;Moist Heat;Ultrasound;Traction;Dry needling;Manual techniques;Neuromuscular re-education;Therapeutic activities;Therapeutic exercise    PT Next Visit Plan  continue manual work and stretching; progress stretching for pecs and cervical musculature; manual work through the anterior chest; cervical muscuature; modalities as indicated     Consulted and Agree with Plan of Care  Patient       Patient will benefit from skilled therapeutic intervention in order to improve the following deficits and impairments:  Postural dysfunction, Improper body mechanics, Pain, Increased fascial restricitons, Increased muscle spasms, Hypomobility, Decreased mobility, Decreased activity tolerance  Visit Diagnosis: Cervicalgia  Other symptoms and signs involving the musculoskeletal system  Abnormal posture     Problem List Patient Active Problem List    Diagnosis Date Noted  . Cervical radiculopathy at C8 12/13/2016  . Menorrhagia with regular cycle 10/07/2016  . No energy 12/10/2015  . Sweaty palms 12/10/2015  . Shaky 12/10/2015  . Dizziness 12/10/2015  . Hemangioma  06/28/2014  . Shingles 06/28/2014    Steed Kanaan Nilda Simmer PT, MPH  12/20/2016, 10:18 AM  [facility] [place] [place], [place], [address] Phone: [phone]   Fax:  [phone]  Name: Areona Homer MRN: [id number] Date of Birth: Nov 10, 1970  PHYSICAL THERAPY DISCHARGE SUMMARY  Visits from Start of Care: 3  Current functional level related to goals / functional outcomes: See last progress note for discharge status    Remaining deficits: Doing well with report of being symptom free     Education / Equipment: HEP  Plan: Patient agrees to discharge.  Patient goals were partially met. Patient is being discharged due to being pleased with the current functional level.  ?????     Jaquez Farrington P. Helene Kelp PT, MPH 01/21/17 1:54 PM

## 2016-12-22 ENCOUNTER — Encounter: Payer: BLUE CROSS/BLUE SHIELD | Admitting: Rehabilitative and Restorative Service Providers"

## 2016-12-27 ENCOUNTER — Encounter: Payer: Self-pay | Admitting: Physician Assistant

## 2016-12-27 ENCOUNTER — Ambulatory Visit (INDEPENDENT_AMBULATORY_CARE_PROVIDER_SITE_OTHER): Payer: BLUE CROSS/BLUE SHIELD | Admitting: Physician Assistant

## 2016-12-27 VITALS — BP 112/78 | HR 71 | Temp 97.6°F | Ht 64.0 in | Wt 132.0 lb

## 2016-12-27 DIAGNOSIS — N3001 Acute cystitis with hematuria: Secondary | ICD-10-CM

## 2016-12-27 DIAGNOSIS — R300 Dysuria: Secondary | ICD-10-CM | POA: Diagnosis not present

## 2016-12-27 LAB — POCT URINALYSIS DIPSTICK
BILIRUBIN UA: NEGATIVE
GLUCOSE UA: NEGATIVE
Ketones, UA: NEGATIVE
NITRITE UA: POSITIVE
PH UA: 5.5 (ref 5.0–8.0)
Protein, UA: NEGATIVE
Spec Grav, UA: >=1.03 — AB (ref 1.010–1.025)
Urobilinogen, UA: 0.2 E.U./dL

## 2016-12-27 MED ORDER — NITROFURANTOIN MONOHYD MACRO 100 MG PO CAPS: 100.0000 mg | ORAL_CAPSULE | Freq: Two times a day (BID) | ORAL | 0 refills | Status: DC

## 2016-12-27 NOTE — Progress Notes (Signed)
Pt gives UA for dysuria and increased urine frequency. No fever, chills, flank pain.  Marland Kitchen.Diagnoses and all orders for this visit:  Dysuria -     POCT urinalysis dipstick  Acute cystitis with hematuria -     nitrofurantoin, macrocrystal-monohydrate, (MACROBID) 100 MG capsule; Take 1 capsule (100 mg total) by mouth 2 (two) times daily. For 7 days.    .. Results for orders placed or performed in visit on 12/27/16  POCT urinalysis dipstick  Result Value Ref Range   Color, UA yellow    Clarity, UA clear    Glucose, UA neg    Bilirubin, UA neg    Ketones, UA neg    Spec Grav, UA >=1.030 (A) 1.010 - 1.025   Blood, UA moderate    pH, UA 5.5 5.0 - 8.0   Protein, UA neg    Urobilinogen, UA 0.2 0.2 or 1.0 E.U./dL   Nitrite, UA pos    Leukocytes, UA Small (1+) (A) Negative   Treated with macrobid for 7 days. Follow up if symptoms not improving or if worsening.

## 2017-01-19 IMAGING — MR MR HEAD WO/W CM
12 series · 48 of 48 positions shown · IV contrast (multihance)
Comparison: None.

CLINICAL DATA: New onset of LEFT temporal and parietal headache.
Constant for the past 10 days. Tender to the touch. Initial
encounter.

EXAM:
MRI HEAD WITHOUT AND WITH CONTRAST
TECHNIQUE: Multiplanar, multiecho pulse sequences of the brain and surrounding
structures were obtained without and with intravenous contrast.
CONTRAST:  11mL MULTIHANCE GADOBENATE DIMEGLUMINE 529 MG/ML IV SOLN

[Series 2: T1 · sagittal · 5.0mm · 0.45mm/px · 1 of 22 slices shown (1 of 2)]
[im 1/22]
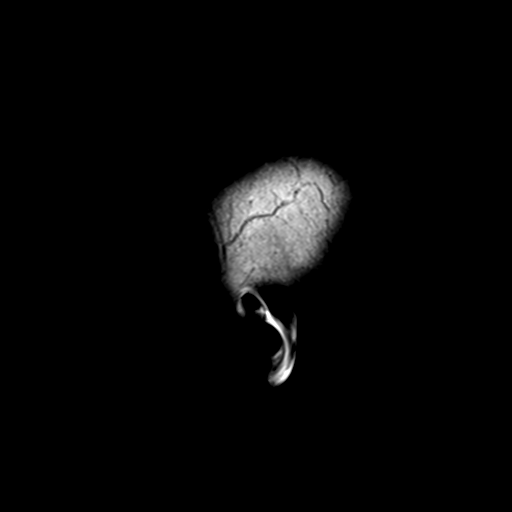

[Series 4: DWI · axial · 3.0mm · 1.20mm/px · z∈[-51,+111]mm · 5 of 55 slices shown (1 of 4)]
[im 1/55]
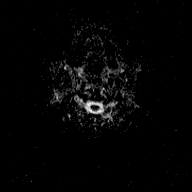
[im 14/55]
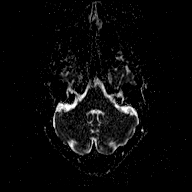
[im 28/55]
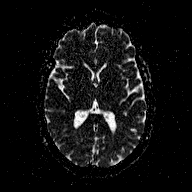
[im 41/55]
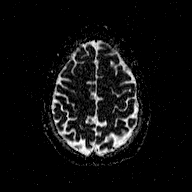
[im 55/55]
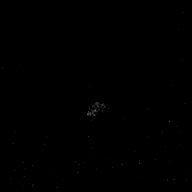

[Series 6: DWI · coronal · 3.0mm · 1.20mm/px · 4 of 49 slices shown (2 of 4)]
[im 1/49]
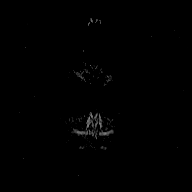
[im 17/49]
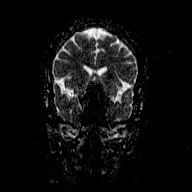
[im 33/49]
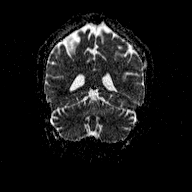
[im 49/49]
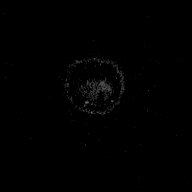

[Series 7: T2 · axial · 5.0mm · 0.72mm/px · z∈[-48,+108]mm · 3 of 25 slices shown (1 of 2)]
[im 1/25]
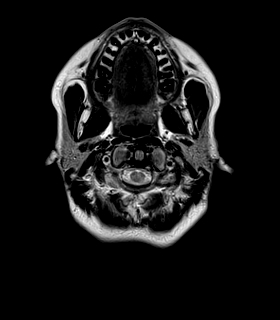
[im 13/25]
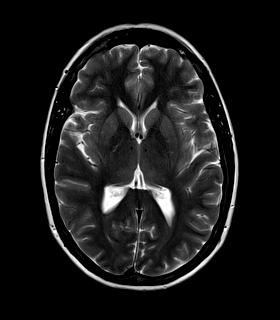
[im 25/25]
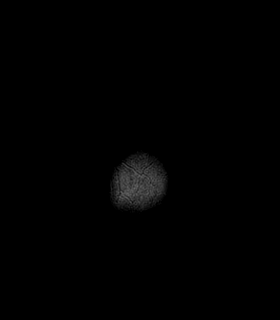

[Series 8: FLAIR · axial · 5.0mm · 0.45mm/px · z∈[-48,+108]mm · 3 of 25 slices shown]
[im 1/25]
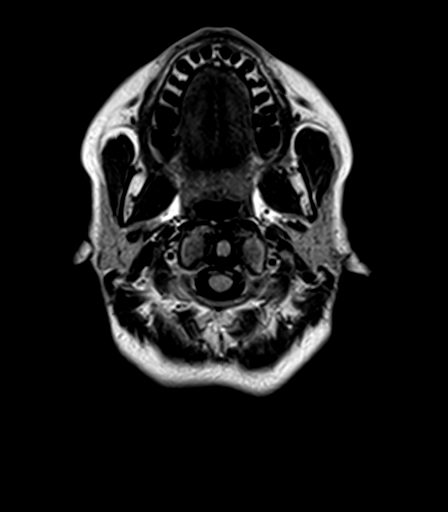
[im 13/25]
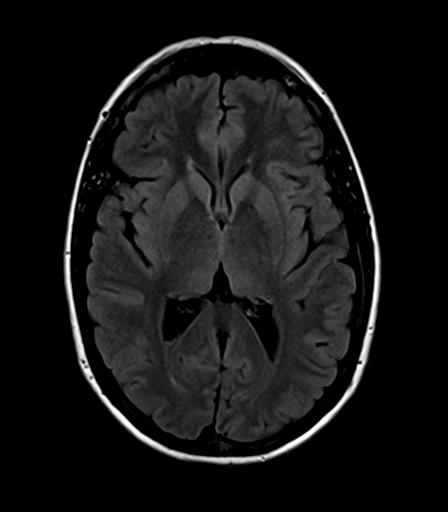
[im 25/25]
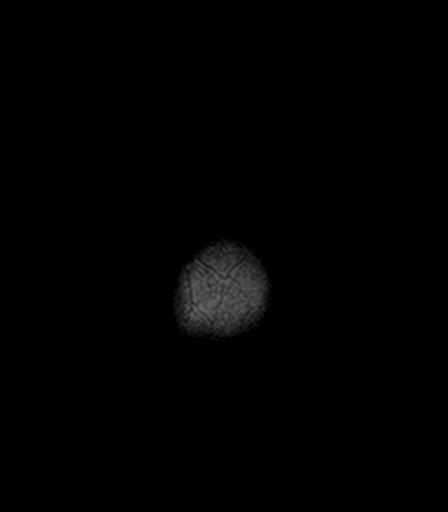

[Series 9: T2 · axial · 5.0mm · 0.72mm/px · z∈[-48,+108]mm · 3 of 25 slices shown (2 of 2)]
[im 1/25]
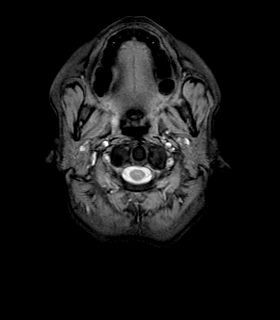
[im 13/25]
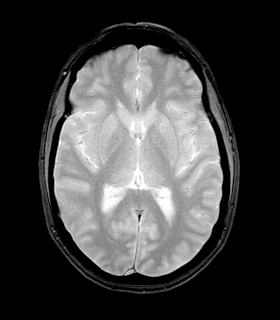
[im 25/25]
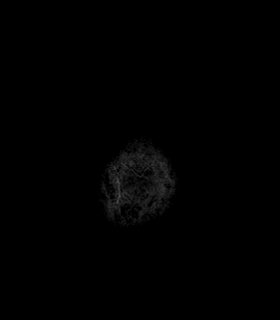

[Series 10: T1 · axial · 3.0mm · 1.00mm/px · z∈[-52,+113]mm · 6 of 56 slices shown (2 of 2)]
[im 1/56]
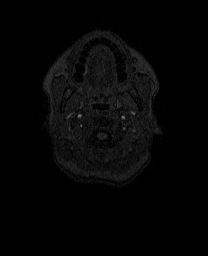
[im 12/56]
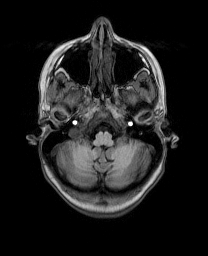
[im 23/56]
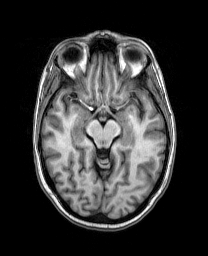
[im 34/56]
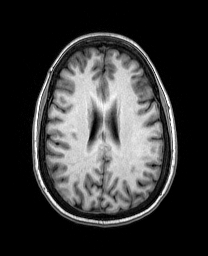
[im 45/56]
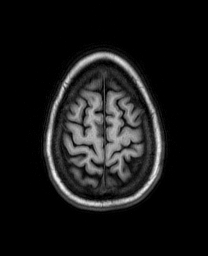
[im 56/56]
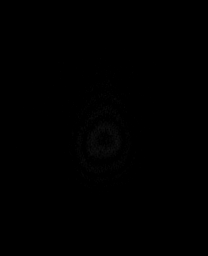

[Series 11: T2 post-contrast · coronal · 5.0mm · 0.45mm/px · 3 of 31 slices shown]
[im 1/31]
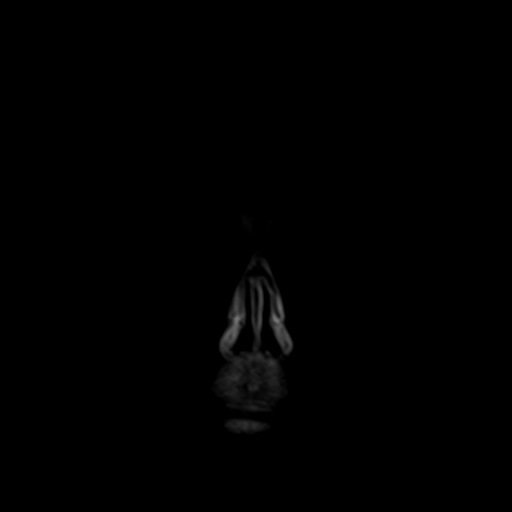
[im 16/31]
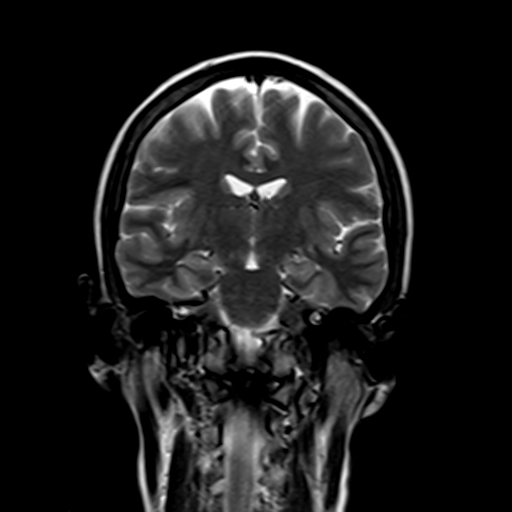
[im 31/31]
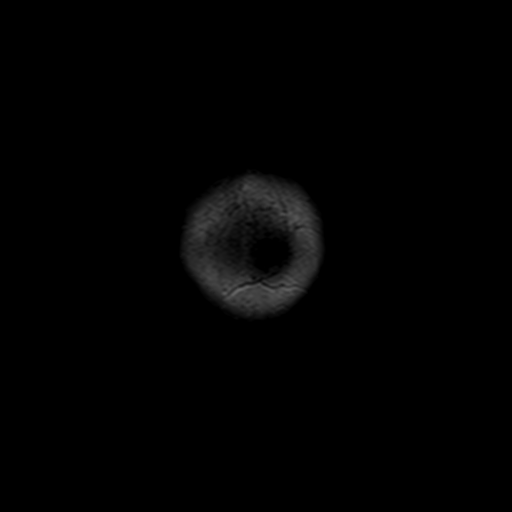

[Series 12: T1 post-contrast · axial · 3.0mm · 1.00mm/px · z∈[-52,+113]mm · 6 of 56 slices shown (1 of 2)]
[im 1/56]
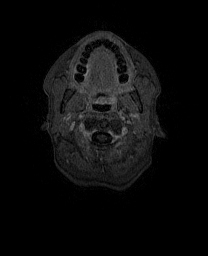
[im 12/56]
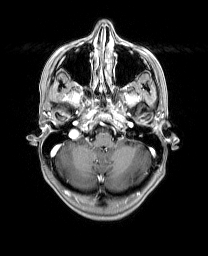
[im 23/56]
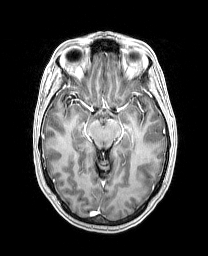
[im 34/56]
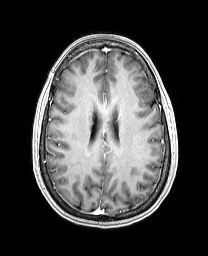
[im 45/56]
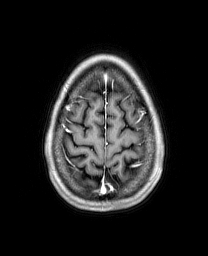
[im 56/56]
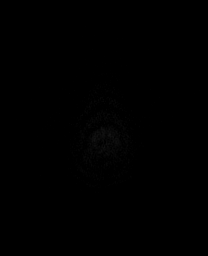

[Series 13: T1 post-contrast · coronal · 5.0mm · 0.45mm/px · 3 of 31 slices shown (2 of 2)]
[im 1/31]
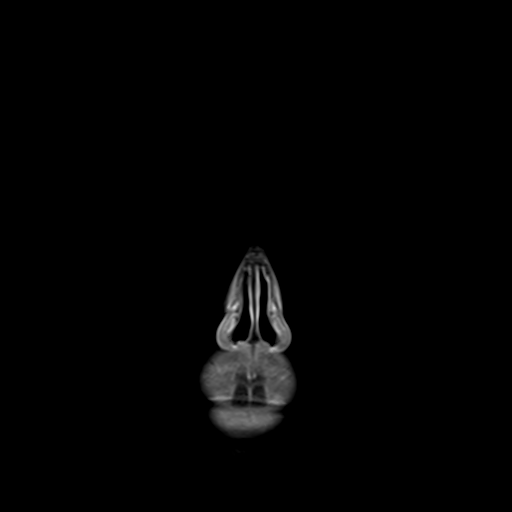
[im 16/31]
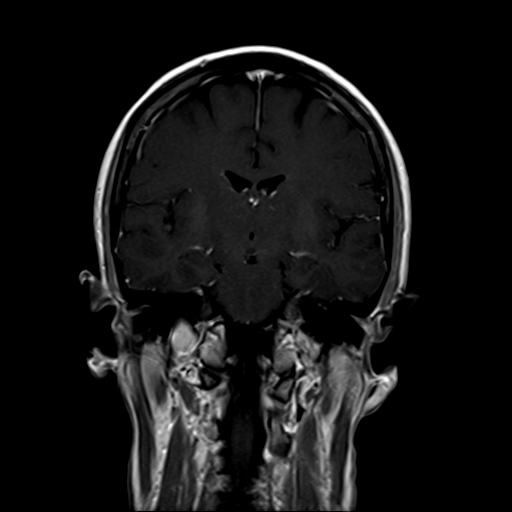
[im 31/31]
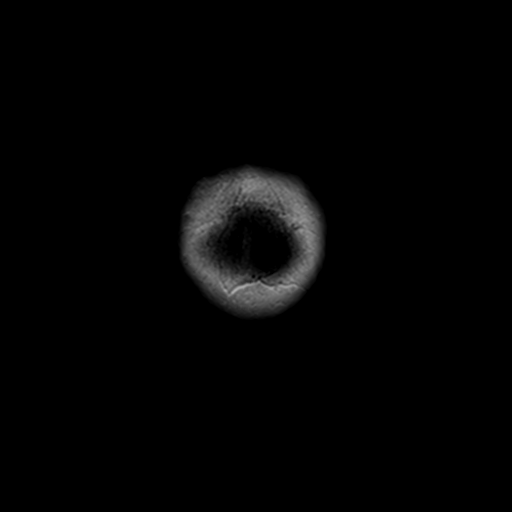

[Series 100: DWI · axial · 3.0mm · 1.20mm/px · z∈[-51,+111]mm · 6 of 55 slices shown (3 of 4)]
[im 1/55]
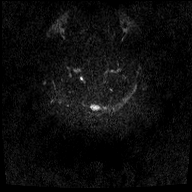
[im 11/55]
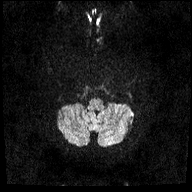
[im 22/55]
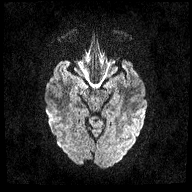
[im 33/55]
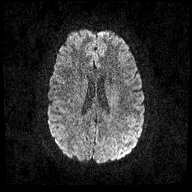
[im 44/55]
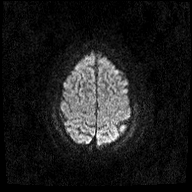
[im 55/55]
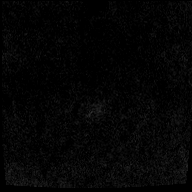

[Series 101: DWI · coronal · 3.0mm · 1.20mm/px · 5 of 49 slices shown (4 of 4)]
[im 1/49]
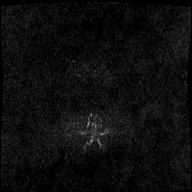
[im 13/49]
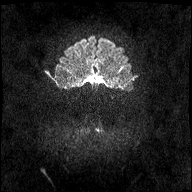
[im 25/49]
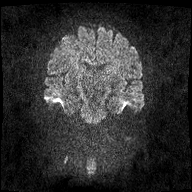
[im 37/49]
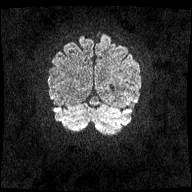
[im 49/49]
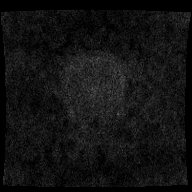

[48 of 48 positions shown; findings below may reference images not displayed]

FINDINGS: Over the LEFT posterior frontal convexity, lying within the diploic
space of the skull, is a subcentimeter T2 hyperintense diploic
lesion, approximately 5 x 7 mm cross-section as seen on image 17
series 7.

The lesion does appear to display facilitated diffusion, but in the
cortex immediately adjacent, there is asymmetric restricted
diffusion confirmed on coronal imaging. Significance is uncertain.
Focal area of mild ischemia not excluded. Infection appears
unlikely.

The calvarial lesion is observed on T2 and FLAIR axial imaging, with
a less well-defined area of both T2 and FLAIR hyperintensity as well
as enhancement throughout the surrounding diploic space up to 1.5 cm
in diameter. Postcontrast enhancement is both focal and diffuse,
again involving the small subcentimeter nidus as seen on
postcontrast axial image 36 series 12, as well as more cloudlike in
the diploic space extending around that structure as seen on coronal
postcontrast image 10 series 13. I suspect this represents an
intradiploic hemangioma. An intradiploic meningioma, epidermoid, or
dermoid, are possibilities, but less favored. While a vascular
metastasis cannot completely be excluded there is no history of
such. The overlying scalp does not appear involved. The outer table
of the skull is not clearly breached, but CT would be more
definitive.

Within the brain itself, there is no visible acute hemorrhage,
intra-axial mass lesion, hydrocephalus, or extra-axial fluid. Flow
voids are maintained. No midline abnormality. Post infusion imaging
demonstrates no abnormal enhancement of brain or meninges. Paranasal
sinuses, mastoids, and orbits are unremarkable.
IMPRESSION: Abnormal enhancing lesion within the diploic space of the calvarium
near the vertex posterior frontal region. Symptomatic calvarial
hemangioma could have this appearance. Intradiploic epidermoid not
excluded.

Tiny focus of diffusion restriction of the LEFT posterior frontal
cortex immediately adjacent to this calvarial lesion of uncertain
significance. See discussion above.

## 2017-05-19 ENCOUNTER — Encounter: Payer: Self-pay | Admitting: Obstetrics & Gynecology

## 2017-05-19 ENCOUNTER — Ambulatory Visit: Payer: BLUE CROSS/BLUE SHIELD | Admitting: Obstetrics & Gynecology

## 2017-05-19 VITALS — BP 131/77 | HR 82 | Resp 16 | Ht 64.0 in

## 2017-05-19 DIAGNOSIS — R102 Pelvic and perineal pain: Secondary | ICD-10-CM | POA: Diagnosis not present

## 2017-05-19 NOTE — Progress Notes (Signed)
   Subjective:    Patient ID: Barbara Nolan, female    DOB: 1970/12/10, 47 y.o.   MRN: [id number]  HPI  Pt has 3 day history of right lower quadrant pain.  Pt worried she left a tampon in 4 weeks ago.  No discharge or odor.  Pt thinks she ovulated not long ago.  No vaginal bleeding.  No dysuria.  Pt has not taken anything for the pain.  Review of Systems  Constitutional: Negative.   Gastrointestinal: Negative.   Genitourinary: Positive for pelvic pain. Negative for urgency, vaginal bleeding and vaginal discharge.       Objective:   Physical Exam  Constitutional: She is oriented to person, place, and time. She appears well-developed and well-nourished. No distress.  HENT:  Head: Normocephalic and atraumatic.  Eyes: Conjunctivae are normal.  Cardiovascular: Normal rate.  Pulmonary/Chest: Effort normal.  Abdominal: Soft. She exhibits no distension. There is no tenderness. There is no rebound and no guarding.  Genitourinary:  Genitourinary Comments: Vagina--no lesion or foreign body, no discharge, no blood Uterus-Mobile non tneder Pain over right ovary.  NO mass Left ovary, NT  Musculoskeletal: She exhibits no edema.  Neurological: She is alert and oriented to person, place, and time.  Skin: Skin is warm and dry.  Psychiatric: She has a normal mood and affect.  Vitals reviewed.  Vitals:   05/19/17 1109  BP: 131/77  Pulse: 82  Resp: 16  Height: 5\' 4"  (1.626 m)      Assessment & Plan:  46 yo female with right lower quadrant pain for 3 weeks. ' Pain over right ovary.  ?rupture cyst or hemorrhagic cyst? If pain continues for 2 weeks or becomes severe, will get TVUS.  RN Carlis Abbott aware of plan.  Pt concurs with plan.  All questions answered.

## 2017-06-21 ENCOUNTER — Emergency Department (INDEPENDENT_AMBULATORY_CARE_PROVIDER_SITE_OTHER): Payer: BLUE CROSS/BLUE SHIELD

## 2017-06-21 ENCOUNTER — Emergency Department (INDEPENDENT_AMBULATORY_CARE_PROVIDER_SITE_OTHER)
Admission: EM | Admit: 2017-06-21 | Discharge: 2017-06-21 | Disposition: A | Discharge status: Home / Self Care | Payer: BLUE CROSS/BLUE SHIELD | Source: Home / Self Care

## 2017-06-21 ENCOUNTER — Encounter: Payer: Self-pay | Admitting: *Deleted

## 2017-06-21 ENCOUNTER — Other Ambulatory Visit: Payer: Self-pay

## 2017-06-21 DIAGNOSIS — M79675 Pain in left toe(s): Secondary | ICD-10-CM | POA: Diagnosis not present

## 2017-06-21 DIAGNOSIS — S93529A Sprain of metatarsophalangeal joint of unspecified toe(s), initial encounter: Secondary | ICD-10-CM

## 2017-06-21 DIAGNOSIS — S99922A Unspecified injury of left foot, initial encounter: Secondary | ICD-10-CM | POA: Diagnosis not present

## 2017-06-21 DIAGNOSIS — M25572 Pain in left ankle and joints of left foot: Secondary | ICD-10-CM | POA: Diagnosis not present

## 2017-06-21 NOTE — ED Triage Notes (Signed)
Pt c/o LT great toe pain post fall down steps yesterday. No OTC meds.

## 2017-06-21 NOTE — Discharge Instructions (Signed)
Buddy tape toes.  Apply ice pack for 20 to 30 minutes, 3 to 4 times daily  Continue until pain and swelling decrease.  May take ibuprofen.

## 2017-06-21 NOTE — ED Provider Notes (Signed)
[name]    CSN: [id number] Arrival date & time: 06/21/17  1117     History   Chief Complaint Chief Complaint  Patient presents with  . Toe Injury    HPI Barbara Nolan is a 47 y.o. female.   Patient fell on stairs yesterday, hyperflexing her left great toe.  She has had persistent pain and bruising.  The history is provided by the patient.  Toe Pain  This is a new problem. The current episode started yesterday. The problem occurs constantly. The problem has not changed since onset.The symptoms are aggravated by walking. Nothing relieves the symptoms. Treatments tried: ice pack. The treatment provided mild relief.    Past Medical History:  Diagnosis Date  . Hemangioma of intracranial structure ([place])   . Hypoglycemia     Patient Active Problem List   Diagnosis Date Noted  . Cervical radiculopathy at C8 12/13/2016  . Menorrhagia with regular cycle 10/07/2016  . No energy 12/10/2015  . Sweaty palms 12/10/2015  . Shaky 12/10/2015  . Dizziness 12/10/2015  . Hemangioma 06/28/2014  . Shingles 06/28/2014    Past Surgical History:  Procedure Laterality Date  . CESAREAN SECTION    . TUBAL LIGATION Bilateral     OB History    Gravida  2   Para  2   Term  2   Preterm      AB      Living  2     SAB      TAB      Ectopic      Multiple      Live Births  2            Home Medications    Prior to Admission medications   Medication Sig Start Date End Date Taking? Authorizing Provider  AMBULATORY NON FORMULARY MEDICATION Blood glucometer, lancets and strips to test up to twice a day for hypoglycemic events. 12/09/15   Breeback, Jade L, PA-C  Loratadine (CLARITIN PO) Take by mouth as needed.     [provider]    Family History Family History  Problem Relation Age of Onset  . Cancer Mother        Breast CA Age 52  . Diabetes Father     Social History Social History   Tobacco Use  . Smoking status: Never Smoker  .  Smokeless tobacco: Never Used  Substance Use Topics  . Alcohol use: No    Alcohol/week: 0.0 oz  . Drug use: No     Allergies   Ciprofloxacin   Review of Systems Review of Systems  All other systems reviewed and are negative.    Physical Exam Triage Vital Signs ED Triage Vitals  Enc Vitals Group     BP 06/21/17 1131 103/67     Pulse Rate 06/21/17 1131 95     Resp 06/21/17 1131 16     Temp 06/21/17 1131 97.6 F (36.4 C)     Temp Source 06/21/17 1131 Oral     SpO2 06/21/17 1131 100 %     Weight 06/21/17 1133 159 lb (72.1 kg)     Height 06/21/17 1133 5\' 4"  (1.626 m)     Head Circumference --      Peak Flow --      Pain Score 06/21/17 1131 2     Pain Loc --      Pain Edu? --      Excl. in GC? --    No  data found.  Updated Vital Signs BP 103/67 (BP Location: Right Arm)   Pulse 95   Temp 97.6 F (36.4 C) (Oral)   Resp 16   Ht 5\' 4"  (1.626 m)   Wt 159 lb (72.1 kg)   SpO2 100%   BMI 27.29 kg/m   Visual Acuity Right Eye Distance:   Left Eye Distance:   Bilateral Distance:    Right Eye Near:   Left Eye Near:    Bilateral Near:     Physical Exam  Constitutional: She appears well-developed. No distress.  Cardiovascular: Normal rate.  Pulmonary/Chest: Effort normal.  Musculoskeletal:       Left foot: There is tenderness, bony tenderness and swelling. There is normal range of motion, normal capillary refill, no crepitus, no deformity and no laceration.       Feet:  Left great toe has tenderness to palpation over the MTP joint, especially on lateral aspect. Distal neurovascular function is intact.  Flexion/extension is intact.  Neurological: She is alert.  Skin: Skin is warm and dry.  Nursing note and vitals reviewed.    UC Treatments / Results  Labs (all labs ordered are listed, but only abnormal results are displayed) Labs Reviewed - No data to display  EKG None  Radiology Dg Toe Great Left  Result Date: 06/21/2017 CLINICAL DATA:  Left great  toe pain after hyperextension injury yesterday. EXAM: LEFT GREAT TOE COMPARISON:  None. FINDINGS: There is no evidence of fracture or dislocation. There is no evidence of arthropathy or other focal bone abnormality. Soft tissues are unremarkable. IMPRESSION: Negative. Electronically Signed   By: Titus Dubin M.D.   On: 06/21/2017 11:52    Procedures Procedures (including critical care time)  Medications Ordered in UC Medications - No data to display  Initial Impression / Assessment and Plan / UC Course  I have reviewed the triage vital signs and the nursing notes.  Pertinent labs & imaging results that were available during my care of the patient were reviewed by me and considered in my medical decision making (see chart for details).    Followup with Dr. Aundria Mems or Dr. Lynne Leader ([facility]) if not improving about two weeks.    Final Clinical Impressions(s) / UC Diagnoses   Final diagnoses:  Turf toe, initial encounter     Discharge Instructions     Buddy tape toes.  Apply ice pack for 20 to 30 minutes, 3 to 4 times daily  Continue until pain and swelling decrease.  May take ibuprofen.   ED Prescriptions    None         [name], MD 06/21/17 1250

## 2017-08-27 ENCOUNTER — Emergency Department
Admission: EM | Admit: 2017-08-27 | Discharge: 2017-08-27 | Disposition: A | Discharge status: Home / Self Care | Payer: BLUE CROSS/BLUE SHIELD | Source: Home / Self Care | Attending: Family Medicine | Admitting: Family Medicine

## 2017-08-27 ENCOUNTER — Encounter: Payer: Self-pay | Admitting: Emergency Medicine

## 2017-08-27 DIAGNOSIS — N3001 Acute cystitis with hematuria: Secondary | ICD-10-CM

## 2017-08-27 LAB — POCT URINALYSIS DIP (MANUAL ENTRY)
Bilirubin, UA: NEGATIVE
Glucose, UA: NEGATIVE mg/dL
Ketones, POC UA: NEGATIVE mg/dL
Nitrite, UA: NEGATIVE
Spec Grav, UA: 1.02 (ref 1.010–1.025)
Urobilinogen, UA: 0.2 E.U./dL
pH, UA: 7 (ref 5.0–8.0)

## 2017-08-27 MED ORDER — NITROFURANTOIN MONOHYD MACRO 100 MG PO CAPS: 100.0000 mg | ORAL_CAPSULE | Freq: Two times a day (BID) | ORAL | 0 refills | Status: DC

## 2017-08-27 NOTE — ED Triage Notes (Signed)
Patient woke this morning with C/O dysuria, and urgency, since 5 am.

## 2017-08-27 NOTE — Discharge Instructions (Signed)
°  Please follow up with family medicine in 1 week if not improving, sooner if worsening.

## 2017-08-27 NOTE — ED Provider Notes (Signed)
[facility] C[facility]    CSN: [id number] Arrival date & time: 08/27/17  0905     History   Chief Complaint Chief Complaint  Patient presents with  . Dysuria    HPI Ameisha Mcclellan is a 47 y.o. female.   HPI Idaly Verret is a 47 y.o. female presenting to UC with c/o sudden onset urinary urgency and burning, 6/10 in severity, that started at [facility].  Mild bladder discomfort.  No fever, chills, n/v/d. She has not tried anything PTA because she knows Azo can alter the point of care UA.   Past Medical History:  Diagnosis Date  . Hemangioma of intracranial structure ([place])   . Hypoglycemia     Patient Active Problem List   Diagnosis Date Noted  . Cervical radiculopathy at C8 12/13/2016  . Menorrhagia with regular cycle 10/07/2016  . No energy 12/10/2015  . Sweaty palms 12/10/2015  . Shaky 12/10/2015  . Dizziness 12/10/2015  . Hemangioma 06/28/2014  . Shingles 06/28/2014    Past Surgical History:  Procedure Laterality Date  . CESAREAN SECTION    . TUBAL LIGATION Bilateral     OB History    Gravida  2   Para  2   Term  2   Preterm      AB      Living  2     SAB      TAB      Ectopic      Multiple      Live Births  2            Home Medications    Prior to Admission medications   Medication Sig Start Date End Date Taking? Authorizing Provider  AMBULATORY NON FORMULARY MEDICATION Blood glucometer, lancets and strips to test up to twice a day for hypoglycemic events. 12/09/15   Breeback, Jade L, PA-C  Loratadine (CLARITIN PO) Take by mouth as needed.     [provider]  nitrofurantoin, macrocrystal-monohydrate, (MACROBID) 100 MG capsule Take 1 capsule (100 mg total) by mouth 2 (two) times daily. 08/27/17   Noe Gens, PA-C    Family History Family History  Problem Relation Age of Onset  . Cancer Mother        Breast CA Age 57  . Diabetes Father     Social History Social History   Tobacco Use  . Smoking status: Never Smoker    . Smokeless tobacco: Never Used  Substance Use Topics  . Alcohol use: No    Alcohol/week: 0.0 oz  . Drug use: No     Allergies   Ciprofloxacin   Review of Systems Review of Systems  Constitutional: Negative for chills and fever.  Gastrointestinal: Positive for abdominal pain (mild bladder discomfort). Negative for diarrhea, nausea and vomiting.  Genitourinary: Positive for dysuria, frequency and urgency. Negative for decreased urine volume and hematuria.  Musculoskeletal: Negative for back pain.     Physical Exam Triage Vital Signs ED Triage Vitals  Enc Vitals Group     BP      Pulse      Resp      Temp      Temp src      SpO2      Weight      Height      Head Circumference      Peak Flow      Pain Score      Pain Loc      Pain Edu?  Excl. in [place]?    No data found.  Updated Vital Signs BP 117/78 (BP Location: Right Arm)   Pulse 100   Temp 97.9 F (36.6 C) (Oral)   Resp 16   Ht 5\' 5"  (1.651 m)   Wt 130 lb (59 kg)   SpO2 98%   BMI 21.63 kg/m   Visual Acuity Right Eye Distance:   Left Eye Distance:   Bilateral Distance:    Right Eye Near:   Left Eye Near:    Bilateral Near:     Physical Exam  Constitutional: She is oriented to person, place, and time. She appears well-developed and well-nourished. No distress.  HENT:  Head: Normocephalic and atraumatic.  Mouth/Throat: Oropharynx is clear and moist.  Eyes: EOM are normal.  Neck: Normal range of motion.  Cardiovascular: Normal rate and regular rhythm.  Pulmonary/Chest: Effort normal and breath sounds normal. No stridor. No respiratory distress. She has no wheezes. She has no rales.  Abdominal: Soft. She exhibits no distension. There is no tenderness. There is no CVA tenderness.  Musculoskeletal: Normal range of motion.  Neurological: She is alert and oriented to person, place, and time.  Skin: Skin is warm and dry. She is not diaphoretic.  Psychiatric: She has a normal mood and affect. Her  behavior is normal.  Nursing note and vitals reviewed.    UC Treatments / Results  Labs (all labs ordered are listed, but only abnormal results are displayed) Labs Reviewed  POCT URINALYSIS DIP (MANUAL ENTRY) - Abnormal; Notable for the following components:      Result Value   Blood, UA moderate (*)    Protein Ur, POC trace (*)    Leukocytes, UA Moderate (2+) (*)    All other components within normal limits    EKG None  Radiology No results found.  Procedures Procedures (including critical care time)  Medications Ordered in UC Medications - No data to display  Initial Impression / Assessment and Plan / UC Course  I have reviewed the triage vital signs and the nursing notes.  Pertinent labs & imaging results that were available during my care of the patient were reviewed by me and considered in my medical decision making (see chart for details).     Hx and UA c/w UTI Pt states macrobid always works for her.  Declined sending off culture.  Final Clinical Impressions(s) / UC Diagnoses   Final diagnoses:  Acute cystitis with hematuria     Discharge Instructions      Please follow up with family medicine in 1 week if not improving, sooner if worsening.    ED Prescriptions    Medication Sig Dispense Auth. Provider   nitrofurantoin, macrocrystal-monohydrate, (MACROBID) 100 MG capsule Take 1 capsule (100 mg total) by mouth 2 (two) times daily. 10 capsule Noe Gens, PA-C     Controlled Substance Prescriptions [place] Controlled Substance Registry consulted? Not Applicable   Tyrell Antonio 08/27/17 1158

## 2017-10-13 ENCOUNTER — Encounter: Payer: Self-pay | Admitting: Obstetrics & Gynecology

## 2017-10-13 ENCOUNTER — Ambulatory Visit: Payer: BLUE CROSS/BLUE SHIELD | Admitting: Obstetrics & Gynecology

## 2017-10-13 VITALS — BP 104/63 | HR 83 | Resp 16 | Ht 64.0 in | Wt 133.0 lb

## 2017-10-13 DIAGNOSIS — N920 Excessive and frequent menstruation with regular cycle: Secondary | ICD-10-CM

## 2017-10-13 DIAGNOSIS — Z124 Encounter for screening for malignant neoplasm of cervix: Secondary | ICD-10-CM | POA: Diagnosis not present

## 2017-10-13 DIAGNOSIS — Z1151 Encounter for screening for human papillomavirus (HPV): Secondary | ICD-10-CM | POA: Diagnosis not present

## 2017-10-13 DIAGNOSIS — Z0000 Encounter for general adult medical examination without abnormal findings: Secondary | ICD-10-CM

## 2017-10-13 NOTE — Progress Notes (Signed)
   Subjective:    Patient ID: Barbara Nolan, female    DOB: July 12, 1970, 47 y.o.   MRN: [id number]  HPI 47 yo married P2 (42 and 4 yo kids) here today with the issue of irregular bleeding. She started a period on 09-15-17 and bled until 10-02-17. She then started bleeding again 10-09-17.  She had a BTL for 21 years.    Review of Systems Mammogram due Mom with breast cancer in her 42s    Objective:   Physical Exam Breathing, conversing, and ambulating normally Well nourished, well hydrated White female, no apparent distress Moderate uterine prolapse, good pubic arch normal size and shape, anteverted, mobile, non-tender, normal adnexal exam    Assessment & Plan:  Preventative- pap with cotesting DUB- check cbc, tsh, gyn u/s She will get flu vaccine at work She declines genetic testing at this point If work up is negative, then rec watchful waiting as she is not particularly interested in OCPs

## 2017-10-13 NOTE — Addendum Note (Signed)
Addended by: Asencion Islam on: 10/13/2017 03:11 PM   Modules accepted: Orders

## 2017-10-14 LAB — CBC
HCT: 30 % — ABNORMAL LOW (ref 35.0–45.0)
Hemoglobin: 10 g/dL — ABNORMAL LOW (ref 11.7–15.5)
MCH: 29.5 pg (ref 27.0–33.0)
MCHC: 33.3 g/dL (ref 32.0–36.0)
MCV: 88.5 fL (ref 80.0–100.0)
MPV: 9.6 fL (ref 7.5–12.5)
PLATELETS: 385 10*3/uL (ref 140–400)
RBC: 3.39 10*6/uL — ABNORMAL LOW (ref 3.80–5.10)
RDW: 12.5 % (ref 11.0–15.0)
WBC: 5.7 10*3/uL (ref 3.8–10.8)

## 2017-10-14 LAB — TSH: TSH: 1.37 m[IU]/L

## 2017-10-17 LAB — CYTOLOGY - PAP
Diagnosis: NEGATIVE
HPV (WINDOPATH): NOT DETECTED

## 2017-10-18 ENCOUNTER — Other Ambulatory Visit: Payer: Self-pay | Admitting: *Deleted

## 2017-10-18 DIAGNOSIS — N921 Excessive and frequent menstruation with irregular cycle: Secondary | ICD-10-CM

## 2017-10-18 NOTE — Progress Notes (Signed)
Order placed for TVU per Dr Alease Medina last office note.

## 2017-10-19 ENCOUNTER — Ambulatory Visit (INDEPENDENT_AMBULATORY_CARE_PROVIDER_SITE_OTHER): Payer: BLUE CROSS/BLUE SHIELD

## 2017-10-19 DIAGNOSIS — N921 Excessive and frequent menstruation with irregular cycle: Secondary | ICD-10-CM

## 2017-10-19 DIAGNOSIS — Z1231 Encounter for screening mammogram for malignant neoplasm of breast: Secondary | ICD-10-CM

## 2017-10-19 DIAGNOSIS — N920 Excessive and frequent menstruation with regular cycle: Secondary | ICD-10-CM | POA: Diagnosis not present

## 2017-10-19 DIAGNOSIS — Z0000 Encounter for general adult medical examination without abnormal findings: Secondary | ICD-10-CM

## 2018-04-25 ENCOUNTER — Telehealth: Payer: Self-pay | Admitting: Physician Assistant

## 2018-04-25 MED ORDER — NITROFURANTOIN MONOHYD MACRO 100 MG PO CAPS
100.0000 mg | ORAL_CAPSULE | Freq: Two times a day (BID) | ORAL | 0 refills | Status: DC
Start: 2018-04-25 — End: 2018-11-09

## 2018-04-25 NOTE — Telephone Encounter (Signed)
Pt has dysuria for 2 days. No fever, chills, flank pain, nausea or vomiting.   UA in office showed nitratres and leukocytes positive.   Allergy to cipro.   Will send macrobid.

## 2018-10-31 ENCOUNTER — Other Ambulatory Visit: Payer: Self-pay | Admitting: Physician Assistant

## 2018-10-31 DIAGNOSIS — Z1231 Encounter for screening mammogram for malignant neoplasm of breast: Secondary | ICD-10-CM

## 2018-11-09 ENCOUNTER — Telehealth (INDEPENDENT_AMBULATORY_CARE_PROVIDER_SITE_OTHER): Payer: BC Managed Care – PPO | Admitting: Osteopathic Medicine

## 2018-11-09 ENCOUNTER — Ambulatory Visit (INDEPENDENT_AMBULATORY_CARE_PROVIDER_SITE_OTHER): Payer: BC Managed Care – PPO

## 2018-11-09 ENCOUNTER — Other Ambulatory Visit: Payer: Self-pay

## 2018-11-09 DIAGNOSIS — Z1231 Encounter for screening mammogram for malignant neoplasm of breast: Secondary | ICD-10-CM | POA: Diagnosis not present

## 2018-11-09 DIAGNOSIS — R300 Dysuria: Secondary | ICD-10-CM

## 2018-11-09 LAB — POCT URINALYSIS DIPSTICK
Bilirubin, UA: NEGATIVE
Glucose, UA: NEGATIVE
Ketones, UA: NEGATIVE
Nitrite, UA: NEGATIVE
Protein, UA: NEGATIVE
Spec Grav, UA: >=1.03 — AB (ref 1.010–1.025)
Urobilinogen, UA: 0.2 E.U./dL
pH, UA: 5.5 (ref 5.0–8.0)

## 2018-11-09 MED ORDER — NITROFURANTOIN MONOHYD MACRO 100 MG PO CAPS: 100.0000 mg | ORAL_CAPSULE | Freq: Two times a day (BID) | ORAL | 0 refills | Status: DC

## 2018-11-09 NOTE — Telephone Encounter (Signed)
Dysuria x2 days. Pharmacy on file correct. UA and culture completed.

## 2018-11-09 NOTE — Telephone Encounter (Signed)
Pt advised of Rx.  

## 2018-11-10 NOTE — Progress Notes (Signed)
Normal mammogram. Follow up in 1 year.

## 2018-11-11 LAB — URINE CULTURE
MICRO NUMBER:: 969216
SPECIMEN QUALITY:: ADEQUATE

## 2019-01-02 ENCOUNTER — Emergency Department (INDEPENDENT_AMBULATORY_CARE_PROVIDER_SITE_OTHER)
Admission: EM | Admit: 2019-01-02 | Discharge: 2019-01-02 | Disposition: A | Discharge status: Home / Self Care | Payer: BC Managed Care – PPO | Source: Home / Self Care

## 2019-01-02 DIAGNOSIS — R067 Sneezing: Secondary | ICD-10-CM

## 2019-01-02 DIAGNOSIS — Z1159 Encounter for screening for other viral diseases: Secondary | ICD-10-CM

## 2019-01-02 DIAGNOSIS — R0981 Nasal congestion: Secondary | ICD-10-CM | POA: Diagnosis not present

## 2019-01-02 LAB — POC SARS CORONAVIRUS 2 AG -  ED: SARS Coronavirus 2 Ag: NEGATIVE

## 2019-01-02 NOTE — ED Notes (Signed)
Patient contacted [employer] due to sneezing and congestion today; requested a rapid covid which was done and is negative; given permission to to return to work.

## 2019-01-02 NOTE — ED Triage Notes (Signed)
Reports sneezing and congestion this morning; denies fever and all other symptoms.

## 2019-01-05 ENCOUNTER — Encounter: Payer: Self-pay | Admitting: *Deleted

## 2019-01-05 ENCOUNTER — Other Ambulatory Visit: Payer: Self-pay

## 2019-01-05 ENCOUNTER — Emergency Department (INDEPENDENT_AMBULATORY_CARE_PROVIDER_SITE_OTHER)
Admission: EM | Admit: 2019-01-05 | Discharge: 2019-01-05 | Disposition: A | Discharge status: Home / Self Care | Payer: BC Managed Care – PPO | Source: Home / Self Care | Attending: Family Medicine | Admitting: Family Medicine

## 2019-01-05 DIAGNOSIS — Z1159 Encounter for screening for other viral diseases: Secondary | ICD-10-CM | POA: Diagnosis not present

## 2019-01-05 DIAGNOSIS — R0981 Nasal congestion: Secondary | ICD-10-CM

## 2019-01-05 LAB — POC SARS CORONAVIRUS 2 AG -  ED: SARS Coronavirus 2 Ag: NEGATIVE

## 2019-01-05 NOTE — ED Triage Notes (Signed)
Pt c/o nasal congestion x 5 days.

## 2019-01-05 NOTE — ED Provider Notes (Signed)
Barbara Nolan CARE    CSN: [id number] Arrival date & time: 01/05/19  [id number]      History   Chief Complaint Chief Complaint  Patient presents with  . Nasal Congestion    HPI Barbara Nolan is a 48 y.o. female.   Patient has a history of seasonal rhinitis spring and fall.  She has had mild nasal congestion for several days but feels well.   She denies chest tightness, shortness of breath, and changes in taste/smell.    The history is provided by the patient.    Past Medical History:  Diagnosis Date  . Hemangioma of intracranial structure ([place])   . Hypoglycemia     Patient Active Problem List   Diagnosis Date Noted  . Cervical radiculopathy at C8 12/13/2016  . Menorrhagia with regular cycle 10/07/2016  . No energy 12/10/2015  . Sweaty palms 12/10/2015  . Shaky 12/10/2015  . Dizziness 12/10/2015  . Hemangioma 06/28/2014  . Shingles 06/28/2014    Past Surgical History:  Procedure Laterality Date  . CESAREAN SECTION    . TUBAL LIGATION Bilateral     OB History    Gravida  2   Para  2   Term  2   Preterm      AB      Living  2     SAB      TAB      Ectopic      Multiple      Live Births  2            Home Medications    Prior to Admission medications   Medication Sig Start Date End Date Taking? Authorizing Provider  Ascorbic Acid (VITAMIN C) 100 MG tablet Take 100 mg by mouth daily.   Yes [provider]  VITAMIN D PO Take by mouth.   Yes [provider]  AMBULATORY NON FORMULARY MEDICATION Blood glucometer, lancets and strips to test up to twice a day for hypoglycemic events. 12/09/15   [name], PA-C  megestrol (MEGACE) 40 MG tablet  10/11/17   [provider]    Family History Family History  Problem Relation Age of Onset  . Cancer Mother        Breast CA Age 35  . Diabetes Father     Social History Social History   Tobacco Use  . Smoking status: Never Smoker  . Smokeless tobacco: Never  Used  Substance Use Topics  . Alcohol use: No    Alcohol/week: 0.0 standard drinks  . Drug use: No     Allergies   Ciprofloxacin   Review of Systems Review of Systems No sore throat No cough No pleuritic pain No wheezing + nasal congestion No post-nasal drainage No sinus pain/pressure No itchy/red eyes No earache No hemoptysis No SOB No fever/chills No nausea No vomiting No abdominal pain No diarrhea No urinary symptoms No skin rash No fatigue No myalgias No headache   Physical Exam Triage Vital Signs ED Triage Vitals  Enc Vitals Group     BP 01/05/19 0817 118/75     Pulse Rate 01/05/19 0817 96     Resp 01/05/19 0817 16     Temp 01/05/19 0817 98.5 F (36.9 C)     Temp Source 01/05/19 0817 Oral     SpO2 01/05/19 0817 97 %     Weight --      Height --      Head Circumference --  Peak Flow --      Pain Score 01/05/19 0818 0     Pain Loc --      Pain Edu? --      Excl. in [place]? --    No data found.  Updated Vital Signs BP 118/75 (BP Location: Right Arm)   Pulse 96   Temp 98.5 F (36.9 C) (Oral)   Resp 16   SpO2 97%   Visual Acuity Right Eye Distance:   Left Eye Distance:   Bilateral Distance:    Right Eye Near:   Left Eye Near:    Bilateral Near:     Physical Exam Vitals signs and nursing note reviewed.  Constitutional:      General: She is not in acute distress. Lymphadenopathy:     Cervical: No cervical adenopathy.  Neurological:     Mental Status: She is alert.   Patient not examined otherwise   UC Treatments / Results  Labs (all labs ordered are listed, but only abnormal results are displayed) Labs Reviewed  NOVEL CORONAVIRUS, NAA  POC SARS CORONAVIRUS 2 AG -  ED negative    EKG   Radiology No results found.  Procedures Procedures (including critical care time)  Medications Ordered in UC Medications - No data to display  Initial Impression / Assessment and Plan / UC Course  I have reviewed the triage vital  signs and the nursing notes.  Pertinent labs & imaging results that were available during my care of the patient were reviewed by me and considered in my medical decision making (see chart for details).    No historical evidence illness.   COVID19 POC test negative Final Clinical Impressions(s) / UC Diagnoses   Final diagnoses:  Nasal congestion     Discharge Instructions     Treat symptoms with antihistamines as needed    ED Prescriptions    None        [name], MD 01/05/19 [phone]

## 2019-01-05 NOTE — Discharge Instructions (Signed)
Treat symptoms with antihistamines as needed

## 2019-01-07 ENCOUNTER — Telehealth: Payer: Self-pay

## 2019-01-07 NOTE — Telephone Encounter (Signed)
Pt called inquiring about COVID results. Informed that results are still pending.

## 2019-01-08 LAB — NOVEL CORONAVIRUS, NAA: SARS-CoV-2, NAA: NOT DETECTED

## 2019-02-13 ENCOUNTER — Other Ambulatory Visit: Payer: Self-pay

## 2019-02-13 ENCOUNTER — Emergency Department (INDEPENDENT_AMBULATORY_CARE_PROVIDER_SITE_OTHER)
Admission: EM | Admit: 2019-02-13 | Discharge: 2019-02-13 | Disposition: A | Discharge status: Home / Self Care | Payer: BC Managed Care – PPO | Source: Home / Self Care | Attending: Family Medicine | Admitting: Family Medicine

## 2019-02-13 ENCOUNTER — Encounter: Payer: Self-pay | Admitting: Emergency Medicine

## 2019-02-13 DIAGNOSIS — Z20822 Contact with and (suspected) exposure to covid-19: Secondary | ICD-10-CM

## 2019-02-13 NOTE — ED Triage Notes (Signed)
Patient here for COVID testing prior to travelling in a few days. 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 She has had her influenza vacc this season.

## 2019-02-15 LAB — NOVEL CORONAVIRUS, NAA: SARS-CoV-2, NAA: NOT DETECTED

## 2019-02-15 NOTE — ED Provider Notes (Signed)
[name] CARE    CSN: [id number] Arrival date & time: 02/13/19  1754      History   Chief Complaint Chief Complaint  Patient presents with  . covid testing    for travel    HPI Barbara Nolan is a 49 y.o. female.   Patient presents for COVID19 testing.  She is completely assymptomatic at present.  The history is provided by the patient.    Past Medical History:  Diagnosis Date  . Hemangioma of intracranial structure ([place])   . Hypoglycemia     Patient Active Problem List   Diagnosis Date Noted  . Cervical radiculopathy at C8 12/13/2016  . Menorrhagia with regular cycle 10/07/2016  . No energy 12/10/2015  . Sweaty palms 12/10/2015  . Shaky 12/10/2015  . Dizziness 12/10/2015  . Hemangioma 06/28/2014  . Shingles 06/28/2014    Past Surgical History:  Procedure Laterality Date  . CESAREAN SECTION    . TUBAL LIGATION Bilateral     OB History    Gravida  2   Para  2   Term  2   Preterm      AB      Living  2     SAB      TAB      Ectopic      Multiple      Live Births  2            Home Medications    Prior to Admission medications   Medication Sig Start Date End Date Taking? Authorizing Provider  AMBULATORY NON FORMULARY MEDICATION Blood glucometer, lancets and strips to test up to twice a day for hypoglycemic events. 12/09/15   Breeback, Royetta Car, PA-C  Ascorbic Acid (VITAMIN C) 100 MG tablet Take 100 mg by mouth daily.    [provider]  megestrol (MEGACE) 40 MG tablet  10/11/17   [provider]  VITAMIN D PO Take by mouth.    [provider]    Family History Family History  Problem Relation Age of Onset  . Cancer Mother        Breast CA Age 80  . Diabetes Father     Social History Social History   Tobacco Use  . Smoking status: Never Smoker  . Smokeless tobacco: Never Used  Substance Use Topics  . Alcohol use: No    Alcohol/week: 0.0 standard drinks  . Drug use: No      Allergies   Ciprofloxacin   Review of Systems Review of Systems No sore throat No cough No pleuritic pain No wheezing No nasal congestion No post-nasal drainage No sinus pain/pressure No itchy/red eyes No earache No hemoptysis No SOB No fever/chills No nausea No vomiting No abdominal pain No diarrhea No urinary symptoms No skin rash No fatigue No myalgias No headache   Physical Exam Triage Vital Signs ED Triage Vitals  Enc Vitals Group     BP 02/13/19 1824 112/77     Pulse Rate 02/13/19 1824 88     Resp 02/13/19 1824 16     Temp 02/13/19 1824 97.9 F (36.6 C)     Temp Source 02/13/19 1824 Oral     SpO2 02/13/19 1824 98 %     Weight 02/13/19 1825 140 lb (63.5 kg)     Height 02/13/19 1825 5\' 5"  (1.651 m)     Head Circumference --      Peak Flow --      Pain Score 02/13/19  1825 0     Pain Loc --      Pain Edu? --      Excl. in [place]? --    No data found.  Updated Vital Signs BP 112/77 (BP Location: Right Arm)   Pulse 88   Temp 97.9 F (36.6 C) (Oral)   Resp 16   Ht 5\' 5"  (1.651 m)   Wt 63.5 kg   SpO2 98%   BMI 23.30 kg/m   Visual Acuity Right Eye Distance:   Left Eye Distance:   Bilateral Distance:    Right Eye Near:   Left Eye Near:    Bilateral Near:     Physical Exam Nursing note reviewed.  Constitutional:      General: She is not in acute distress. Neurological:     Mental Status: She is alert.   Patient not examined otherwise.   UC Treatments / Results  Labs (all labs ordered are listed, but only abnormal results are displayed) Labs Reviewed  NOVEL CORONAVIRUS, NAA   Narrative:    Performed at:  [address] [place], [place]  [id number] Lab Director: Rush Farmer MD, Phone:  [phone]    EKG   Radiology No results found.  Procedures Procedures (including critical care time)  Medications Ordered in UC Medications - No data to display  Initial Impression / Assessment and Plan / UC  Course  I have reviewed the triage vital signs and the nursing notes.  Pertinent labs & imaging results that were available during my care of the patient were reviewed by me and considered in my medical decision making (see chart for details).     Patient assymptomatic at present.   COVID19 send out  Final Clinical Impressions(s) / UC Diagnoses   Final diagnoses:  Exposure to COVID-19 virus   Discharge Instructions   None    ED Prescriptions    None        [name], MD 02/15/19 2348

## 2019-03-26 ENCOUNTER — Other Ambulatory Visit: Payer: Self-pay

## 2019-03-26 ENCOUNTER — Emergency Department
Admission: EM | Admit: 2019-03-26 | Discharge: 2019-03-26 | Disposition: A | Discharge status: Home / Self Care | Payer: BC Managed Care – PPO | Source: Home / Self Care | Attending: Family Medicine | Admitting: Family Medicine

## 2019-03-26 DIAGNOSIS — Z20822 Contact with and (suspected) exposure to covid-19: Secondary | ICD-10-CM | POA: Diagnosis not present

## 2019-03-26 DIAGNOSIS — N3090 Cystitis, unspecified without hematuria: Secondary | ICD-10-CM | POA: Diagnosis not present

## 2019-03-26 LAB — POCT URINALYSIS DIP (MANUAL ENTRY)
Bilirubin, UA: NEGATIVE
Glucose, UA: NEGATIVE mg/dL
Ketones, POC UA: NEGATIVE mg/dL
Nitrite, UA: NEGATIVE
Protein Ur, POC: 30 mg/dL — AB
Spec Grav, UA: 1.025 (ref 1.010–1.025)
Urobilinogen, UA: 0.2 E.U./dL
pH, UA: 5.5 (ref 5.0–8.0)

## 2019-03-26 MED ORDER — NITROFURANTOIN MONOHYD MACRO 100 MG PO CAPS: 100.0000 mg | ORAL_CAPSULE | Freq: Two times a day (BID) | ORAL | 0 refills | Status: AC

## 2019-03-26 NOTE — ED Provider Notes (Signed)
[name] C[facility]    CSN: [id number] Arrival date & time: 03/26/19  [id number]      History   Chief Complaint Chief Complaint  Patient presents with  . Dysuria    HPI Barbara Nolan is a 49 y.o. female.   At 2am today patient developed dysuria, followed by frequency, urgency, and hesitancy.  She feels well otherwise and denies fever or abdominal pain.  The history is provided by the patient.  Dysuria Pain quality:  Burning Pain severity:  Mild Onset quality:  Sudden Duration:  7 hours Timing:  Constant Progression:  Worsening Chronicity:  New Recent urinary tract infections: no   Relieved by:  Phenazopyridine Worsened by:  Nothing Urinary symptoms: frequent urination and hesitancy   Urinary symptoms: no discolored urine, no foul-smelling urine, no hematuria and no bladder incontinence   Associated symptoms: no abdominal pain, no fever, no flank pain, no nausea and no vomiting     Past Medical History:  Diagnosis Date  . Hemangioma of intracranial structure ([place])   . Hypoglycemia     Patient Active Problem List   Diagnosis Date Noted  . Cervical radiculopathy at C8 12/13/2016  . Menorrhagia with regular cycle 10/07/2016  . No energy 12/10/2015  . Sweaty palms 12/10/2015  . Shaky 12/10/2015  . Dizziness 12/10/2015  . Hemangioma 06/28/2014  . Shingles 06/28/2014    Past Surgical History:  Procedure Laterality Date  . CESAREAN SECTION    . TUBAL LIGATION Bilateral     OB History    Gravida  2   Para  2   Term  2   Preterm      AB      Living  2     SAB      TAB      Ectopic      Multiple      Live Births  2            Home Medications    Prior to Admission medications   Medication Sig Start Date End Date Taking? Authorizing Provider  AMBULATORY NON FORMULARY MEDICATION Blood glucometer, lancets and strips to test up to twice a day for hypoglycemic events. 12/09/15   Breeback, Royetta Car, PA-C  Ascorbic Acid (VITAMIN C) 100 MG  tablet Take 100 mg by mouth daily.    [provider]  megestrol (MEGACE) 40 MG tablet  10/11/17   [provider]  nitrofurantoin, macrocrystal-monohydrate, (MACROBID) 100 MG capsule Take 1 capsule (100 mg total) by mouth 2 (two) times daily. Take with food. 03/26/19   [name], MD  VITAMIN D PO Take by mouth.    [provider]    Family History Family History  Problem Relation Age of Onset  . Cancer Mother        Breast CA Age 3  . Diabetes Father     Social History Social History   Tobacco Use  . Smoking status: Never Smoker  . Smokeless tobacco: Never Used  Substance Use Topics  . Alcohol use: No    Alcohol/week: 0.0 standard drinks  . Drug use: No     Allergies   Ciprofloxacin   Review of Systems Review of Systems  Constitutional: Negative for fever.  Gastrointestinal: Negative for abdominal pain, nausea and vomiting.  Genitourinary: Positive for dysuria. Negative for flank pain.     Physical Exam Triage Vital Signs ED Triage Vitals  Enc Vitals Group     BP 03/26/19 0938 123/83  Pulse Rate 03/26/19 0938 90     Resp 03/26/19 0938 20     Temp 03/26/19 0938 97.8 F (36.6 C)     Temp Source 03/26/19 0938 Oral     SpO2 03/26/19 0938 100 %     Weight 03/26/19 0940 140 lb (63.5 kg)     Height 03/26/19 0940 5\' 5"  (1.651 m)     Head Circumference --      Peak Flow --      Pain Score 03/26/19 0939 0     Pain Loc --      Pain Edu? --      Excl. in [place]? --    No data found.  Updated Vital Signs BP 123/83 (BP Location: Right Arm)   Pulse 90   Temp 97.8 F (36.6 C) (Oral)   Resp 20   Ht 5\' 5"  (1.651 m)   Wt 63.5 kg   LMP  (LMP Unknown)   SpO2 100%   BMI 23.30 kg/m   Visual Acuity Right Eye Distance:   Left Eye Distance:   Bilateral Distance:    Right Eye Near:   Left Eye Near:    Bilateral Near:     Physical Exam Nursing notes and Vital Signs reviewed. Appearance:  Patient appears stated age, and in no  acute distress.    Eyes:  Pupils are equal, round, and reactive to light and accomodation.  Extraocular movement is intact.  Conjunctivae are not inflamed   Pharynx:  Normal; moist mucous membranes  Lungs:  Clear to auscultation.  Breath sounds are equal.  Moving air well. Heart:  Regular rate and rhythm without murmurs, rubs, or gallops.  Abdomen:  Nontender without masses or hepatosplenomegaly.  Bowel sounds are present.  No CVA or flank tenderness.        UC Treatments / Results  Labs (all labs ordered are listed, but only abnormal results are displayed) Labs Reviewed  POCT URINALYSIS DIP (MANUAL ENTRY) - Abnormal; Notable for the following components:      Result Value   Blood, UA moderate (*)    Protein Ur, POC =30 (*)    Leukocytes, UA Small (1+) (*)    All other components within normal limits  NOVEL CORONAVIRUS, NAA    EKG   Radiology No results found.  Procedures Procedures (including critical care time)  Medications Ordered in UC Medications - No data to display  Initial Impression / Assessment and Plan / UC Course  I have reviewed the triage vital signs and the nursing notes.  Pertinent labs & imaging results that were available during my care of the patient were reviewed by me and considered in my medical decision making (see chart for details).    Begin Macrobid.  Urine culture pending. Followup with Family Doctor if not improved in one week.    Final Clinical Impressions(s) / UC Diagnoses   Final diagnoses:  [id number] ruled out  Cystitis     Discharge Instructions     Increase fluid intake. May use non-prescription AZO for about two days, if desired, to decrease urinary discomfort.    ED Prescriptions    Medication Sig Dispense Auth. Provider   nitrofurantoin, macrocrystal-monohydrate, (MACROBID) 100 MG capsule Take 1 capsule (100 mg total) by mouth 2 (two) times daily. Take with food. 14 capsule [name], MD        [name], MD 03/26/19 [phone]

## 2019-03-26 NOTE — ED Triage Notes (Signed)
C/O dysuria since 0200 today - also needs a COVID test due to pt travel this week

## 2019-03-26 NOTE — Discharge Instructions (Addendum)
Increase fluid intake. May use non-prescription AZO for about two days, if desired, to decrease urinary discomfort.  

## 2019-03-27 LAB — URINE CULTURE
MICRO NUMBER:: 10173133
SPECIMEN QUALITY:: ADEQUATE

## 2019-03-27 LAB — NOVEL CORONAVIRUS, NAA: SARS-CoV-2, NAA: NOT DETECTED

## 2022-06-11 LAB — COLOGUARD: COLOGUARD: NEGATIVE
